# Patient Record
Sex: Female | Born: 1940 | Race: White | Hispanic: No | Marital: Married | Smoking: Former smoker
Health system: Southern US, Community
[De-identification: ages and names within clinical notes are randomized; demographics above are authoritative.]

## PROBLEM LIST (undated history)

## (undated) DIAGNOSIS — Z950 Presence of cardiac pacemaker: Secondary | ICD-10-CM

## (undated) DIAGNOSIS — Z86010 Personal history of colonic polyps: Secondary | ICD-10-CM

## (undated) DIAGNOSIS — Z87440 Personal history of urinary (tract) infections: Secondary | ICD-10-CM

## (undated) DIAGNOSIS — M2550 Pain in unspecified joint: Secondary | ICD-10-CM

## (undated) DIAGNOSIS — I499 Cardiac arrhythmia, unspecified: Secondary | ICD-10-CM

## (undated) DIAGNOSIS — I639 Cerebral infarction, unspecified: Secondary | ICD-10-CM

## (undated) DIAGNOSIS — G629 Polyneuropathy, unspecified: Secondary | ICD-10-CM

## (undated) DIAGNOSIS — I10 Essential (primary) hypertension: Secondary | ICD-10-CM

## (undated) DIAGNOSIS — K5900 Constipation, unspecified: Secondary | ICD-10-CM

## (undated) DIAGNOSIS — I509 Heart failure, unspecified: Secondary | ICD-10-CM

## (undated) DIAGNOSIS — R32 Unspecified urinary incontinence: Secondary | ICD-10-CM

## (undated) DIAGNOSIS — R609 Edema, unspecified: Secondary | ICD-10-CM

## (undated) DIAGNOSIS — M1990 Unspecified osteoarthritis, unspecified site: Secondary | ICD-10-CM

## (undated) DIAGNOSIS — R0602 Shortness of breath: Secondary | ICD-10-CM

## (undated) DIAGNOSIS — Q211 Atrial septal defect: Secondary | ICD-10-CM

## (undated) DIAGNOSIS — Z9289 Personal history of other medical treatment: Secondary | ICD-10-CM

## (undated) DIAGNOSIS — M2540 Effusion, unspecified joint: Secondary | ICD-10-CM

## (undated) DIAGNOSIS — E611 Iron deficiency: Secondary | ICD-10-CM

## (undated) DIAGNOSIS — Z95810 Presence of automatic (implantable) cardiac defibrillator: Secondary | ICD-10-CM

## (undated) DIAGNOSIS — S065X9A Traumatic subdural hemorrhage with loss of consciousness of unspecified duration, initial encounter: Secondary | ICD-10-CM

## (undated) HISTORY — PX: CARDIOVERSION: SHX1299

## (undated) HISTORY — PX: SALPINGOOPHORECTOMY: SHX82

## (undated) HISTORY — PX: TONSILLECTOMY: SUR1361

## (undated) HISTORY — PX: WRIST FRACTURE SURGERY: SHX121

## (undated) HISTORY — PX: APPENDECTOMY: SHX54

## (undated) HISTORY — PX: JOINT REPLACEMENT: SHX530

---

## 1999-06-03 HISTORY — PX: TOTAL HIP ARTHROPLASTY: SHX124

## 2003-06-03 HISTORY — PX: COLONOSCOPY: SHX174

## 2005-06-25 ENCOUNTER — Encounter: Admission: RE | Admit: 2005-06-25 | Discharge: 2005-06-25 | Payer: Self-pay | Admitting: Unknown Physician Specialty

## 2007-06-03 HISTORY — PX: CARDIAC CATHETERIZATION: SHX172

## 2007-06-03 HISTORY — PX: OTHER SURGICAL HISTORY: SHX169

## 2007-07-11 IMAGING — US US EXTREM LOW VENOUS*L*
1 series · 14 of 20 positions shown · non-contrast
Comparison: none

CLINICAL DATA: LEFT LOWER EXTREMITY VENOUS ULTRASOUND:
TECHNIQUE: Gray-scale sonography with compression, as well as color and duplex Doppler ultrasound, were performed to evaluate the deep venous system from the level of the common femoral vein through the popliteal and proximal calf veins.

[Series 1: unknown · 14 of 20 slices shown]
[im 1/20]
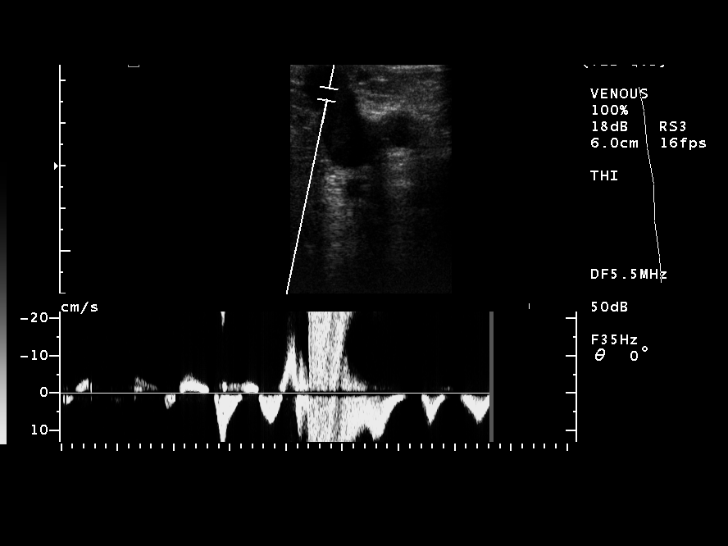
[im 3/20]
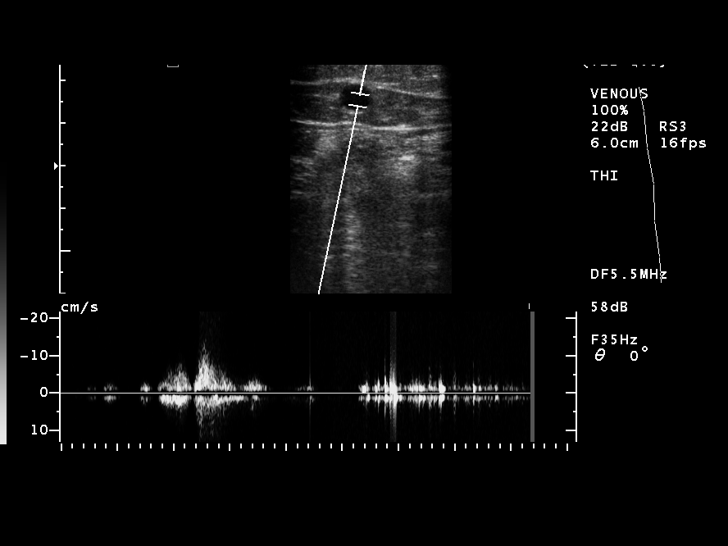
[im 4/20]
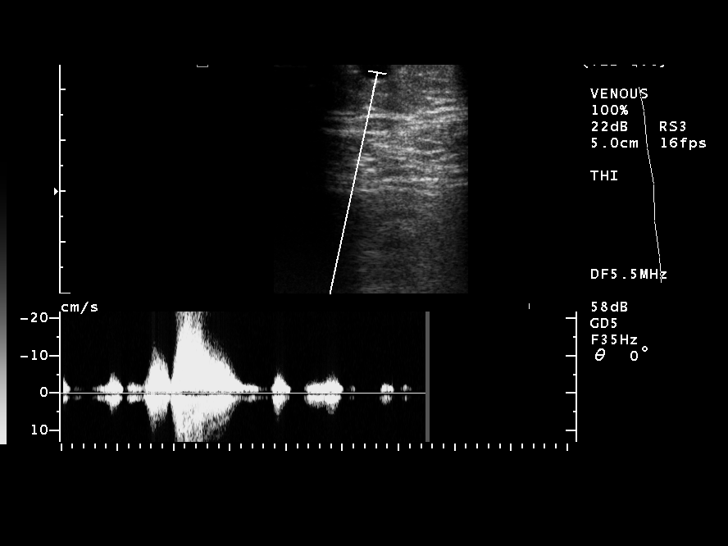
[im 6/20]
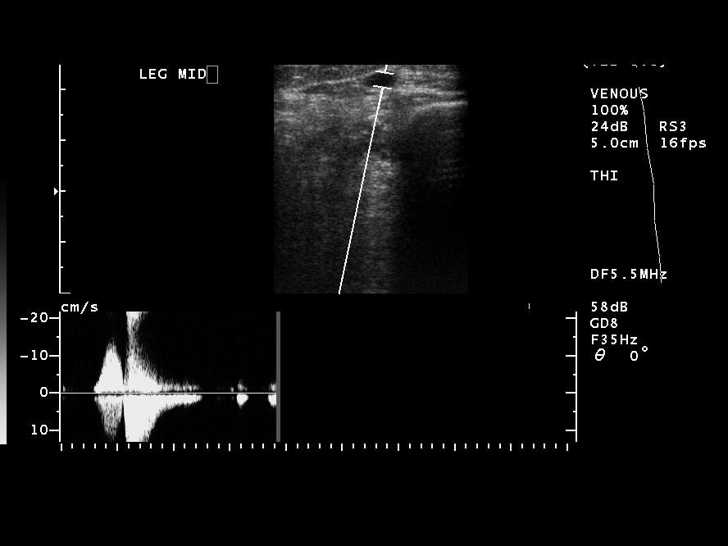
[im 7/20]
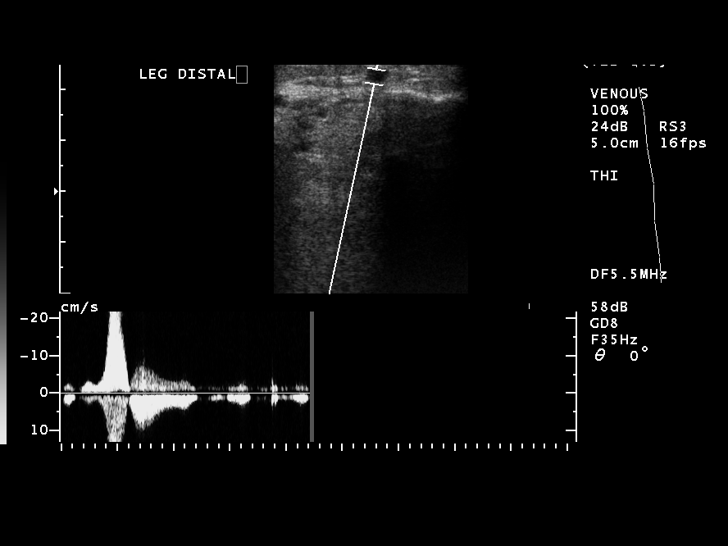
[im 8/20]
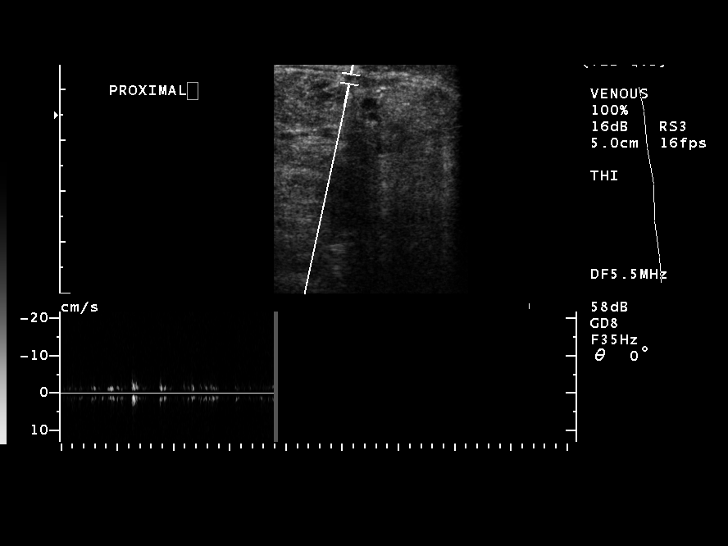
[im 10/20]
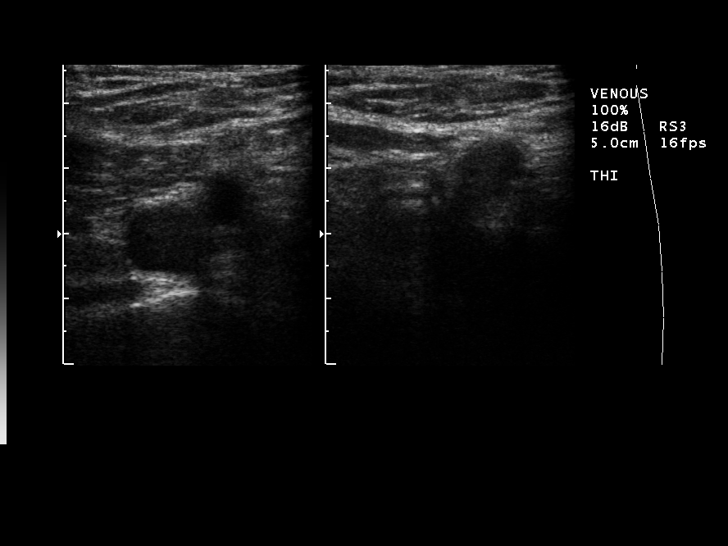
[im 11/20]
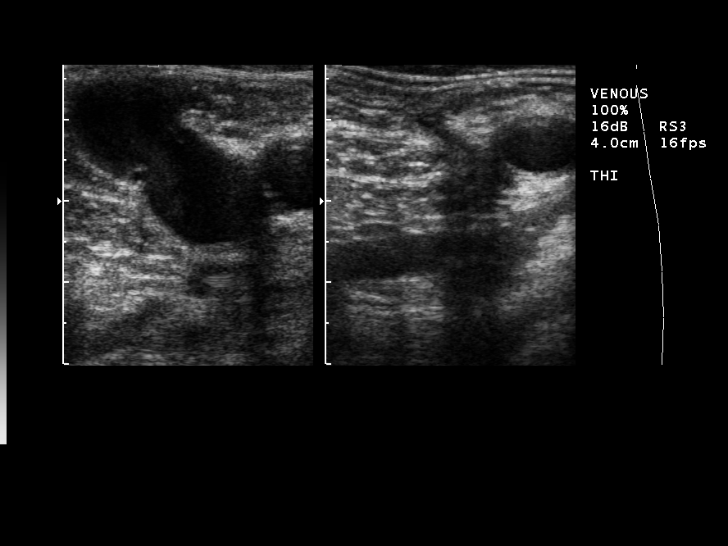
[im 13/20]
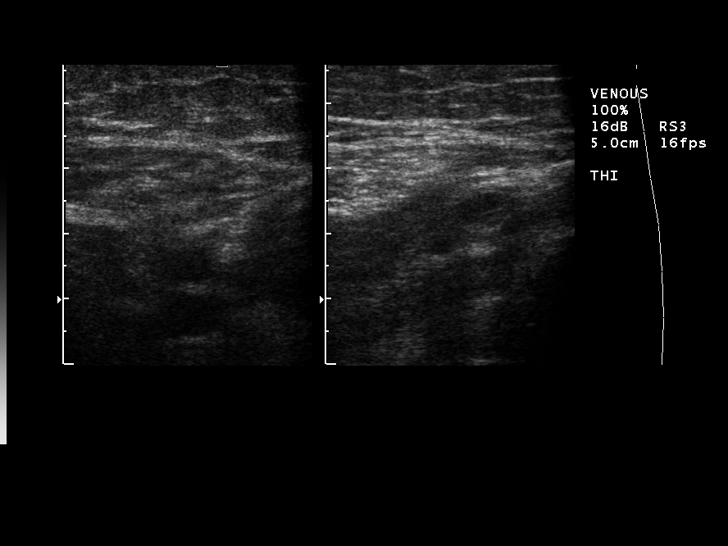
[im 14/20]
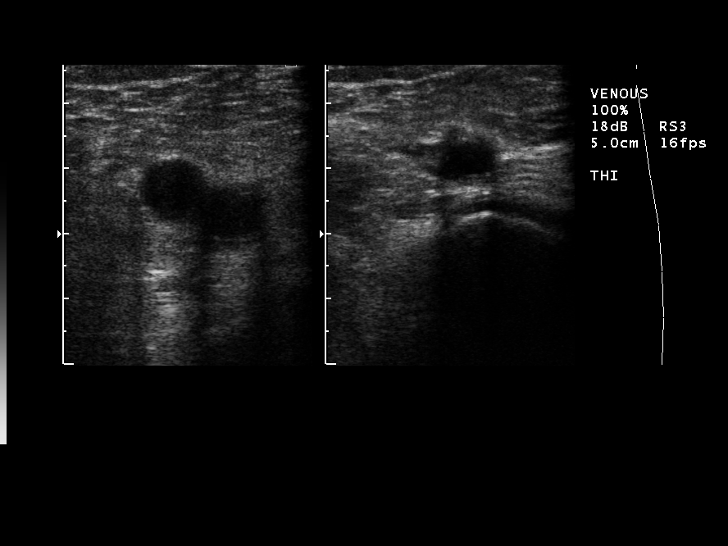
[im 16/20]
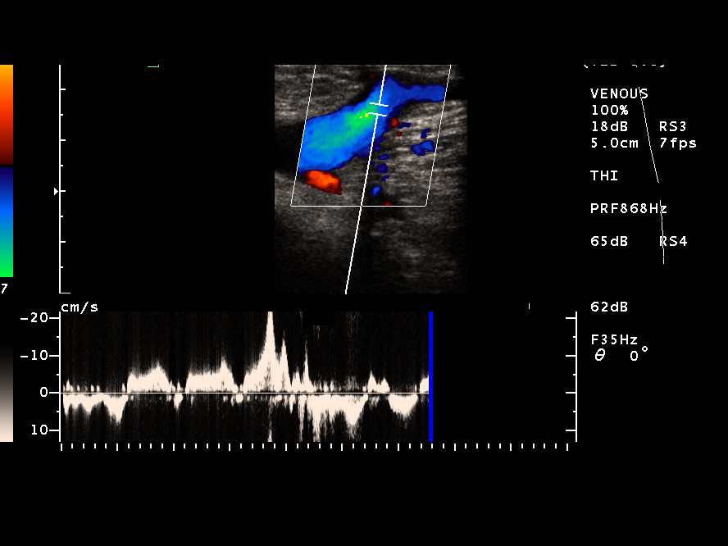
[im 17/20]
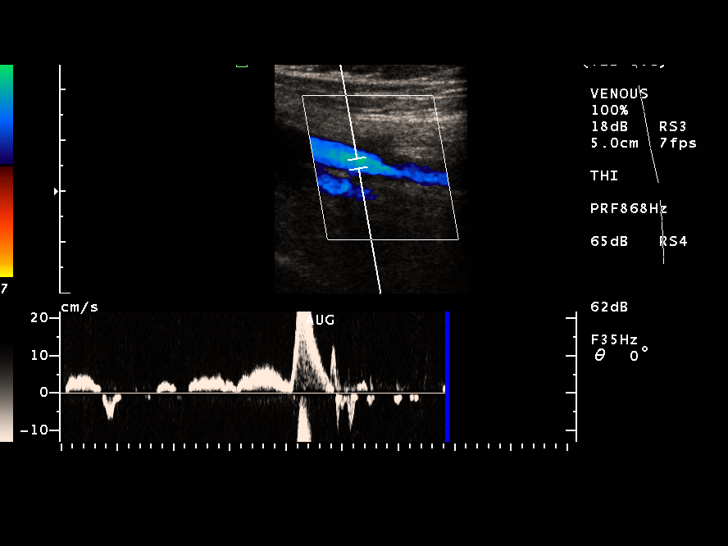
[im 18/20]
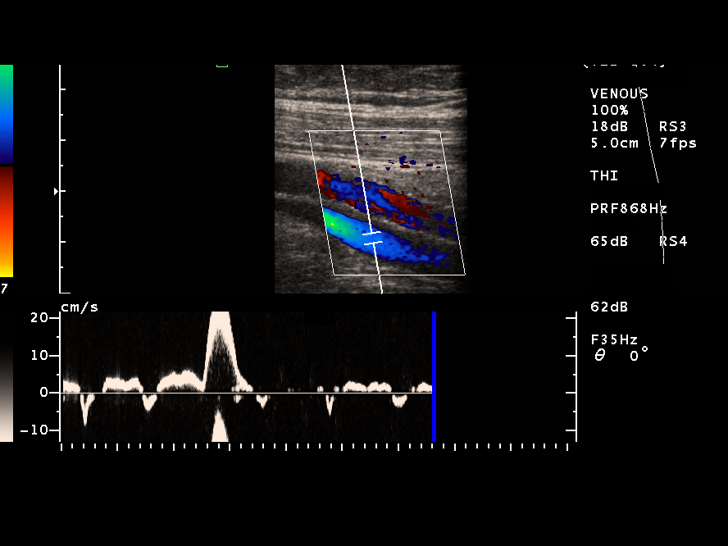
[im 20/20]
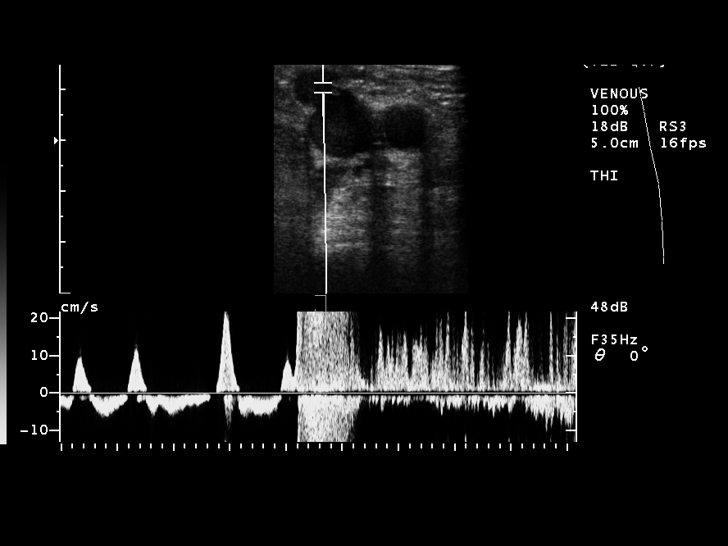

[14 of 20 positions shown; findings below may reference images not displayed]

FINDINGS: The left great saphenous vein refluxes significantly from the saphenofemoral junction all the way to the ankle.  Multiple large varicosities Perlman from the great saphenous vein in the region of the distal thigh and proximal calf.  In the distal thigh, the vessel does leave the sheath superficially, then re-enters below the knee.  The left short saphenous vein is very small and competent without evidence of reflux.  Deep venous system is widely patent with normal compressibility and augmentation.
IMPRESSION: 1.  Significant reflux within the left great saphenous vein from the saphenofemoral junction to the ankle, giving rise to the large varicosities in the medial thigh and calf.  
2.  No DVT.

## 2010-06-23 ENCOUNTER — Encounter: Payer: Self-pay | Admitting: Unknown Physician Specialty

## 2013-03-29 ENCOUNTER — Other Ambulatory Visit: Payer: Self-pay | Admitting: Orthopedic Surgery

## 2013-03-31 ENCOUNTER — Encounter (HOSPITAL_COMMUNITY): Payer: Self-pay | Admitting: Pharmacy Technician

## 2013-04-04 ENCOUNTER — Encounter (HOSPITAL_COMMUNITY): Payer: Self-pay

## 2013-04-04 ENCOUNTER — Encounter (HOSPITAL_COMMUNITY)
Admission: RE | Admit: 2013-04-04 | Discharge: 2013-04-04 | Disposition: A | Discharge status: Home / Self Care | Payer: Medicare Other | Source: Ambulatory Visit | Attending: Orthopedic Surgery | Admitting: Orthopedic Surgery

## 2013-04-04 DIAGNOSIS — Z01818 Encounter for other preprocedural examination: Secondary | ICD-10-CM | POA: Insufficient documentation

## 2013-04-04 DIAGNOSIS — Z01812 Encounter for preprocedural laboratory examination: Secondary | ICD-10-CM | POA: Insufficient documentation

## 2013-04-04 HISTORY — DX: Presence of cardiac pacemaker: Z95.0

## 2013-04-04 HISTORY — DX: Personal history of colonic polyps: Z86.010

## 2013-04-04 HISTORY — DX: Essential (primary) hypertension: I10

## 2013-04-04 HISTORY — DX: Edema, unspecified: R60.9

## 2013-04-04 HISTORY — DX: Unspecified urinary incontinence: R32

## 2013-04-04 HISTORY — DX: Constipation, unspecified: K59.00

## 2013-04-04 HISTORY — DX: Personal history of other medical treatment: Z92.89

## 2013-04-04 HISTORY — DX: Polyneuropathy, unspecified: G62.9

## 2013-04-04 HISTORY — DX: Pain in unspecified joint: M25.50

## 2013-04-04 HISTORY — DX: Cardiac arrhythmia, unspecified: I49.9

## 2013-04-04 HISTORY — DX: Iron deficiency: E61.1

## 2013-04-04 HISTORY — DX: Personal history of urinary (tract) infections: Z87.440

## 2013-04-04 HISTORY — DX: Heart failure, unspecified: I50.9

## 2013-04-04 HISTORY — DX: Effusion, unspecified joint: M25.40

## 2013-04-04 LAB — APTT: aPTT: 35 seconds (ref 24–37)

## 2013-04-04 LAB — SURGICAL PCR SCREEN
MRSA, PCR: NEGATIVE
Staphylococcus aureus: NEGATIVE

## 2013-04-04 LAB — CBC WITH DIFFERENTIAL/PLATELET
Basophils Absolute: 0.1 10*3/uL (ref 0.0–0.1)
Basophils Relative: 1 % (ref 0–1)
Eosinophils Absolute: 0.2 10*3/uL (ref 0.0–0.7)
Eosinophils Relative: 2 % (ref 0–5)
HCT: 36.8 % (ref 36.0–46.0)
Hemoglobin: 12.5 g/dL (ref 12.0–15.0)
Lymphocytes Relative: 22 % (ref 12–46)
MCH: 30.9 pg (ref 26.0–34.0)
MCHC: 34 g/dL (ref 30.0–36.0)
Monocytes Absolute: 0.7 10*3/uL (ref 0.1–1.0)
Monocytes Relative: 9 % (ref 3–12)
Platelets: 246 10*3/uL (ref 150–400)
RDW: 14.4 % (ref 11.5–15.5)
WBC: 7.7 10*3/uL (ref 4.0–10.5)

## 2013-04-04 LAB — COMPREHENSIVE METABOLIC PANEL
Albumin: 3.9 g/dL (ref 3.5–5.2)
BUN: 14 mg/dL (ref 6–23)
Calcium: 9.9 mg/dL (ref 8.4–10.5)
Chloride: 105 mEq/L (ref 96–112)
Creatinine, Ser: 0.49 mg/dL — ABNORMAL LOW (ref 0.50–1.10)
Potassium: 4 mEq/L (ref 3.5–5.1)
Total Bilirubin: 0.4 mg/dL (ref 0.3–1.2)
Total Protein: 7.7 g/dL (ref 6.0–8.3)

## 2013-04-04 LAB — PROTIME-INR
INR: 1.85 — ABNORMAL HIGH (ref 0.00–1.49)
Prothrombin Time: 20.8 seconds — ABNORMAL HIGH (ref 11.6–15.2)

## 2013-04-04 LAB — URINALYSIS, ROUTINE W REFLEX MICROSCOPIC
Bilirubin Urine: NEGATIVE
Nitrite: NEGATIVE
Protein, ur: NEGATIVE mg/dL
Specific Gravity, Urine: 1.013 (ref 1.005–1.030)
Urobilinogen, UA: 0.2 mg/dL (ref 0.0–1.0)

## 2013-04-04 LAB — ABO/RH: ABO/RH(D): O POS

## 2013-04-04 MED ORDER — CHLORHEXIDINE GLUCONATE 4 % EX LIQD: 60.0000 mL | Freq: Once | CUTANEOUS | Status: DC

## 2013-04-04 NOTE — Pre-Procedure Instructions (Signed)
Sara Underwood  04/04/2013   Your procedure is scheduled on:  Mon, Nov 10 @ 7:30 AM  Report to [facility] Short Stay Entrance A at 5:30 AM.  Call this number if you have problems the morning of surgery: [phone]   Remember:   Do not eat food or drink liquids after midnight.   Take these medicines the morning of surgery with A SIP OF WATER: Carvedilol(Coreg) and Diltiazem(Cardizem)              Stop taking your Aspirin.No Goody's,BC's,Aleve,Ibuprofen,Fish Oil,or any Herbal Medications   Do not wear jewelry, make-up or nail polish.  Do not wear lotions, powders, or perfumes. You may wear deodorant.  Do not shave 48 hours prior to surgery.   Do not bring valuables to the hospital.  [facility] is not responsible                  for any belongings or valuables.               Contacts, dentures or bridgework may not be worn into surgery.  Leave suitcase in the car. After surgery it may be brought to your room.  For patients admitted to the hospital, discharge time is determined by your                treatment team.                 Special Instructions: Shower using CHG 2 nights before surgery and the night before surgery.  If you shower the day of surgery use CHG.  Use special wash - you have one bottle of CHG for all showers.  You should use approximately 1/3 of the bottle for each shower.   Please read over the following fact sheets that you were given: Pain Booklet, Coughing and Deep Breathing, Blood Transfusion Information, MRSA Information and Surgical Site Infection Prevention

## 2013-04-04 NOTE — Progress Notes (Signed)
Cardiologist with [facility] is Dr.Ricky Dorris Fetch [phone]  Denies ever having a stress test  Echo to be requested from Dr.Hendrickson-done   Heart cath only x 1 in April 2009  Medical Md is DR.Lajoyce Lauber  EKG to be requested from Dr.Hendrickson  Denies CXR in past yr

## 2013-04-05 LAB — URINE CULTURE: Colony Count: NO GROWTH

## 2013-04-05 LAB — TYPE AND SCREEN

## 2013-04-10 MED ORDER — SODIUM CHLORIDE 0.9 % IV SOLN
1000.0000 mg | INTRAVENOUS | Status: AC
  Administered 2013-04-11: 1000 mg via INTRAVENOUS
  Filled 2013-04-10: qty 10

## 2013-04-10 MED ORDER — CEFAZOLIN SODIUM-DEXTROSE 2-3 GM-% IV SOLR
2.0000 g | INTRAVENOUS | Status: AC
  Administered 2013-04-11: 2 g via INTRAVENOUS
  Filled 2013-04-10: qty 50

## 2013-04-11 ENCOUNTER — Encounter (HOSPITAL_COMMUNITY)
Admission: RE | Disposition: A | Discharge status: Home Health | Payer: Self-pay | Source: Ambulatory Visit | Attending: Orthopedic Surgery

## 2013-04-11 ENCOUNTER — Encounter (HOSPITAL_COMMUNITY): Payer: Self-pay | Admitting: *Deleted

## 2013-04-11 ENCOUNTER — Inpatient Hospital Stay (HOSPITAL_COMMUNITY)
Admission: RE | Admit: 2013-04-11 | Discharge: 2013-04-13 | DRG: 470 | Disposition: A | Discharge status: Home Health | Payer: Medicare Other | Source: Ambulatory Visit | Attending: Orthopedic Surgery | Admitting: Orthopedic Surgery

## 2013-04-11 ENCOUNTER — Inpatient Hospital Stay (HOSPITAL_COMMUNITY): Payer: Medicare Other | Admitting: Certified Registered Nurse Anesthetist

## 2013-04-11 ENCOUNTER — Encounter (HOSPITAL_COMMUNITY): Payer: Medicare Other | Admitting: Certified Registered Nurse Anesthetist

## 2013-04-11 DIAGNOSIS — Z96652 Presence of left artificial knee joint: Secondary | ICD-10-CM

## 2013-04-11 DIAGNOSIS — Z860100 Personal history of colon polyps, unspecified: Secondary | ICD-10-CM

## 2013-04-11 DIAGNOSIS — Z87891 Personal history of nicotine dependence: Secondary | ICD-10-CM

## 2013-04-11 DIAGNOSIS — Z87440 Personal history of urinary (tract) infections: Secondary | ICD-10-CM

## 2013-04-11 DIAGNOSIS — I509 Heart failure, unspecified: Secondary | ICD-10-CM | POA: Diagnosis present

## 2013-04-11 DIAGNOSIS — M1710 Unilateral primary osteoarthritis, unspecified knee: Principal | ICD-10-CM | POA: Diagnosis present

## 2013-04-11 DIAGNOSIS — I4891 Unspecified atrial fibrillation: Secondary | ICD-10-CM | POA: Diagnosis present

## 2013-04-11 DIAGNOSIS — Z95810 Presence of automatic (implantable) cardiac defibrillator: Secondary | ICD-10-CM

## 2013-04-11 DIAGNOSIS — I10 Essential (primary) hypertension: Secondary | ICD-10-CM | POA: Diagnosis present

## 2013-04-11 DIAGNOSIS — D62 Acute posthemorrhagic anemia: Secondary | ICD-10-CM | POA: Diagnosis not present

## 2013-04-11 HISTORY — DX: Unspecified osteoarthritis, unspecified site: M19.90

## 2013-04-11 HISTORY — DX: Presence of automatic (implantable) cardiac defibrillator: Z95.810

## 2013-04-11 HISTORY — PX: TOTAL KNEE ARTHROPLASTY: SHX125

## 2013-04-11 HISTORY — DX: Shortness of breath: R06.02

## 2013-04-11 LAB — CBC
HCT: 31.7 % — ABNORMAL LOW (ref 36.0–46.0)
Hemoglobin: 10.7 g/dL — ABNORMAL LOW (ref 12.0–15.0)
MCH: 30.9 pg (ref 26.0–34.0)
MCV: 91.6 fL (ref 78.0–100.0)
Platelets: 219 10*3/uL (ref 150–400)
RBC: 3.46 MIL/uL — ABNORMAL LOW (ref 3.87–5.11)
RDW: 14 % (ref 11.5–15.5)
WBC: 11.5 10*3/uL — ABNORMAL HIGH (ref 4.0–10.5)

## 2013-04-11 LAB — CREATININE, SERUM
GFR calc Af Amer: >90 mL/min (ref >90)
GFR calc non Af Amer: 89 mL/min — ABNORMAL LOW (ref >90)

## 2013-04-11 SURGERY — ARTHROPLASTY, KNEE, TOTAL
Anesthesia: Regional | Site: Knee | Laterality: Left | Wound class: Clean

## 2013-04-11 MED ORDER — CELECOXIB 200 MG PO CAPS
200.0000 mg | ORAL_CAPSULE | Freq: Two times a day (BID) | ORAL | Status: DC
  Administered 2013-04-11 – 2013-04-13 (×5): 200 mg via ORAL
  Filled 2013-04-11 (×6): qty 1

## 2013-04-11 MED ORDER — METOCLOPRAMIDE HCL 5 MG/ML IJ SOLN: 5.0000 mg | Freq: Three times a day (TID) | INTRAMUSCULAR | Status: DC | PRN

## 2013-04-11 MED ORDER — ACETAMINOPHEN 325 MG PO TABS: 650.0000 mg | ORAL_TABLET | Freq: Four times a day (QID) | ORAL | Status: DC | PRN

## 2013-04-11 MED ORDER — SODIUM CHLORIDE 0.9 % IJ SOLN: INTRAMUSCULAR | Status: DC | PRN

## 2013-04-11 MED ORDER — PROMETHAZINE HCL 25 MG/ML IJ SOLN: 6.2500 mg | INTRAMUSCULAR | Status: DC | PRN

## 2013-04-11 MED ORDER — FENTANYL CITRATE 0.05 MG/ML IJ SOLN
INTRAMUSCULAR | Status: DC | PRN
  Administered 2013-04-11 (×2): 25 ug via INTRAVENOUS
  Administered 2013-04-11: 50 ug via INTRAVENOUS
  Administered 2013-04-11: 25 ug via INTRAVENOUS

## 2013-04-11 MED ORDER — PROPOFOL 10 MG/ML IV BOLUS
INTRAVENOUS | Status: DC | PRN
  Administered 2013-04-11: 160 mg via INTRAVENOUS

## 2013-04-11 MED ORDER — HYDROMORPHONE HCL PF 1 MG/ML IJ SOLN
0.2500 mg | INTRAMUSCULAR | Status: DC | PRN
  Administered 2013-04-11 (×2): 0.5 mg via INTRAVENOUS

## 2013-04-11 MED ORDER — CARVEDILOL 12.5 MG PO TABS
12.5000 mg | ORAL_TABLET | Freq: Two times a day (BID) | ORAL | Status: DC
  Administered 2013-04-11 – 2013-04-13 (×4): 12.5 mg via ORAL
  Filled 2013-04-11 (×6): qty 1

## 2013-04-11 MED ORDER — CEFAZOLIN SODIUM 1-5 GM-% IV SOLN
1.0000 g | Freq: Four times a day (QID) | INTRAVENOUS | Status: AC
  Administered 2013-04-11 (×2): 1 g via INTRAVENOUS
  Filled 2013-04-11 (×2): qty 50

## 2013-04-11 MED ORDER — OXYCODONE HCL 5 MG PO TABS
5.0000 mg | ORAL_TABLET | ORAL | Status: DC | PRN
  Administered 2013-04-11 – 2013-04-12 (×6): 10 mg via ORAL
  Administered 2013-04-13: 5 mg via ORAL
  Filled 2013-04-11 (×5): qty 2
  Filled 2013-04-11: qty 1
  Filled 2013-04-11 (×2): qty 2

## 2013-04-11 MED ORDER — SODIUM CHLORIDE 0.9 % IV SOLN
INTRAVENOUS | Status: DC
  Administered 2013-04-11 – 2013-04-12 (×2): via INTRAVENOUS

## 2013-04-11 MED ORDER — FLEET ENEMA 7-19 GM/118ML RE ENEM: 1.0000 | ENEMA | Freq: Once | RECTAL | Status: AC | PRN

## 2013-04-11 MED ORDER — ACETAMINOPHEN 650 MG RE SUPP: 650.0000 mg | Freq: Four times a day (QID) | RECTAL | Status: DC | PRN

## 2013-04-11 MED ORDER — OXYCODONE HCL ER 10 MG PO T12A
10.0000 mg | EXTENDED_RELEASE_TABLET | Freq: Two times a day (BID) | ORAL | Status: DC
  Administered 2013-04-11 – 2013-04-13 (×4): 10 mg via ORAL
  Filled 2013-04-11 (×4): qty 1

## 2013-04-11 MED ORDER — SENNOSIDES-DOCUSATE SODIUM 8.6-50 MG PO TABS: 1.0000 | ORAL_TABLET | Freq: Every evening | ORAL | Status: DC | PRN

## 2013-04-11 MED ORDER — BUPIVACAINE-EPINEPHRINE PF 0.5-1:200000 % IJ SOLN
INTRAMUSCULAR | Status: DC | PRN
  Administered 2013-04-11: 30 mL

## 2013-04-11 MED ORDER — MEPERIDINE HCL 25 MG/ML IJ SOLN: 6.2500 mg | INTRAMUSCULAR | Status: DC | PRN

## 2013-04-11 MED ORDER — ONDANSETRON HCL 4 MG/2ML IJ SOLN
INTRAMUSCULAR | Status: DC | PRN
  Administered 2013-04-11: 4 mg via INTRAVENOUS

## 2013-04-11 MED ORDER — WARFARIN SODIUM 6 MG PO TABS
6.0000 mg | ORAL_TABLET | Freq: Once | ORAL | Status: AC
  Administered 2013-04-11: 6 mg via ORAL
  Filled 2013-04-11: qty 1

## 2013-04-11 MED ORDER — BUPIVACAINE-EPINEPHRINE 0.25% -1:200000 IJ SOLN
INTRAMUSCULAR | Status: DC | PRN
  Administered 2013-04-11: 30 mL

## 2013-04-11 MED ORDER — WARFARIN - PHARMACIST DOSING INPATIENT
Freq: Every day | Status: DC
  Administered 2013-04-12: 1

## 2013-04-11 MED ORDER — DIPHENHYDRAMINE HCL 12.5 MG/5ML PO ELIX
12.5000 mg | ORAL_SOLUTION | ORAL | Status: DC | PRN
  Administered 2013-04-12 (×2): 25 mg via ORAL
  Administered 2013-04-13: 12.5 mg via ORAL
  Filled 2013-04-11 (×3): qty 10

## 2013-04-11 MED ORDER — BUPIVACAINE HCL (PF) 0.5 % IJ SOLN: INTRAMUSCULAR | Status: DC | PRN

## 2013-04-11 MED ORDER — BUPIVACAINE LIPOSOME 1.3 % IJ SUSP
INTRAMUSCULAR | Status: DC | PRN
  Administered 2013-04-11: 20 mL

## 2013-04-11 MED ORDER — DILTIAZEM HCL ER COATED BEADS 180 MG PO CP24
180.0000 mg | ORAL_CAPSULE | Freq: Every day | ORAL | Status: DC
  Administered 2013-04-12 – 2013-04-13 (×2): 180 mg via ORAL
  Filled 2013-04-11 (×2): qty 1

## 2013-04-11 MED ORDER — METOCLOPRAMIDE HCL 10 MG PO TABS: 5.0000 mg | ORAL_TABLET | Freq: Three times a day (TID) | ORAL | Status: DC | PRN

## 2013-04-11 MED ORDER — ZOLPIDEM TARTRATE 5 MG PO TABS: 5.0000 mg | ORAL_TABLET | Freq: Every evening | ORAL | Status: DC | PRN

## 2013-04-11 MED ORDER — BUPIVACAINE-EPINEPHRINE PF 0.25-1:200000 % IJ SOLN
INTRAMUSCULAR | Status: AC
  Filled 2013-04-11: qty 30

## 2013-04-11 MED ORDER — LIDOCAINE HCL (CARDIAC) 20 MG/ML IV SOLN
INTRAVENOUS | Status: DC | PRN
  Administered 2013-04-11: 70 mg via INTRAVENOUS

## 2013-04-11 MED ORDER — LISINOPRIL 10 MG PO TABS
10.0000 mg | ORAL_TABLET | Freq: Two times a day (BID) | ORAL | Status: DC
  Administered 2013-04-11 – 2013-04-13 (×4): 10 mg via ORAL
  Filled 2013-04-11 (×5): qty 1

## 2013-04-11 MED ORDER — MENTHOL 3 MG MT LOZG: 1.0000 | LOZENGE | OROMUCOSAL | Status: DC | PRN

## 2013-04-11 MED ORDER — OXYCODONE HCL 5 MG/5ML PO SOLN: 5.0000 mg | Freq: Once | ORAL | Status: DC | PRN

## 2013-04-11 MED ORDER — METHOCARBAMOL 100 MG/ML IJ SOLN
500.0000 mg | Freq: Four times a day (QID) | INTRAVENOUS | Status: DC | PRN
  Filled 2013-04-11: qty 5

## 2013-04-11 MED ORDER — HYDROMORPHONE HCL PF 1 MG/ML IJ SOLN
INTRAMUSCULAR | Status: AC
  Filled 2013-04-11: qty 1

## 2013-04-11 MED ORDER — LACTATED RINGERS IV SOLN
INTRAVENOUS | Status: DC | PRN
  Administered 2013-04-11 (×2): via INTRAVENOUS

## 2013-04-11 MED ORDER — ONDANSETRON HCL 4 MG PO TABS: 4.0000 mg | ORAL_TABLET | Freq: Four times a day (QID) | ORAL | Status: DC | PRN

## 2013-04-11 MED ORDER — MIDAZOLAM HCL 5 MG/5ML IJ SOLN
INTRAMUSCULAR | Status: DC | PRN
  Administered 2013-04-11 (×2): 1 mg via INTRAVENOUS

## 2013-04-11 MED ORDER — BUPIVACAINE LIPOSOME 1.3 % IJ SUSP
20.0000 mL | Freq: Once | INTRAMUSCULAR | Status: DC
  Filled 2013-04-11: qty 20

## 2013-04-11 MED ORDER — DOCUSATE SODIUM 100 MG PO CAPS
100.0000 mg | ORAL_CAPSULE | Freq: Two times a day (BID) | ORAL | Status: DC
  Administered 2013-04-11 – 2013-04-13 (×5): 100 mg via ORAL
  Filled 2013-04-11 (×6): qty 1

## 2013-04-11 MED ORDER — HYDROMORPHONE HCL PF 1 MG/ML IJ SOLN
1.0000 mg | INTRAMUSCULAR | Status: DC | PRN
  Administered 2013-04-12: 1 mg via INTRAVENOUS
  Filled 2013-04-11: qty 1

## 2013-04-11 MED ORDER — ENOXAPARIN SODIUM 30 MG/0.3ML ~~LOC~~ SOLN
30.0000 mg | Freq: Two times a day (BID) | SUBCUTANEOUS | Status: DC
  Administered 2013-04-12 – 2013-04-13 (×3): 30 mg via SUBCUTANEOUS
  Filled 2013-04-11 (×5): qty 0.3

## 2013-04-11 MED ORDER — OXYCODONE HCL 5 MG PO TABS: 5.0000 mg | ORAL_TABLET | Freq: Once | ORAL | Status: DC | PRN

## 2013-04-11 MED ORDER — ALUM & MAG HYDROXIDE-SIMETH 200-200-20 MG/5ML PO SUSP: 30.0000 mL | ORAL | Status: DC | PRN

## 2013-04-11 MED ORDER — ONDANSETRON HCL 4 MG/2ML IJ SOLN: 4.0000 mg | Freq: Four times a day (QID) | INTRAMUSCULAR | Status: DC | PRN

## 2013-04-11 MED ORDER — BISACODYL 5 MG PO TBEC: 5.0000 mg | DELAYED_RELEASE_TABLET | Freq: Every day | ORAL | Status: DC | PRN

## 2013-04-11 MED ORDER — PHENOL 1.4 % MT LIQD: 1.0000 | OROMUCOSAL | Status: DC | PRN

## 2013-04-11 MED ORDER — SODIUM CHLORIDE 0.9 % IV SOLN: INTRAVENOUS | Status: DC

## 2013-04-11 MED ORDER — METHOCARBAMOL 500 MG PO TABS
500.0000 mg | ORAL_TABLET | Freq: Four times a day (QID) | ORAL | Status: DC | PRN
  Administered 2013-04-12: 500 mg via ORAL
  Filled 2013-04-11 (×3): qty 1

## 2013-04-11 MED ORDER — SODIUM CHLORIDE 0.9 % IR SOLN
Status: DC | PRN
  Administered 2013-04-11: 3000 mL

## 2013-04-11 SURGICAL SUPPLY — 56 items
BANDAGE ESMARK 6X9 LF (GAUZE/BANDAGES/DRESSINGS) ×1 IMPLANT
BLADE SAGITTAL 13X1.27X60 (BLADE) ×2 IMPLANT
BLADE SAW SGTL 83.5X18.5 (BLADE) ×2 IMPLANT
BNDG CMPR 9X6 STRL LF SNTH (GAUZE/BANDAGES/DRESSINGS) ×1
BNDG ESMARK 6X9 LF (GAUZE/BANDAGES/DRESSINGS) ×2
BOWL SMART MIX CTS (DISPOSABLE) ×2 IMPLANT
CAP POR TM CP VIT E LN CER HD ×1 IMPLANT
CEMENT BONE SIMPLEX SPEEDSET (Cement) ×4 IMPLANT
CLOTH BEACON ORANGE TIMEOUT ST (SAFETY) ×2 IMPLANT
COVER SURGICAL LIGHT HANDLE (MISCELLANEOUS) ×2 IMPLANT
CUFF TOURNIQUET SINGLE 34IN LL (TOURNIQUET CUFF) ×2 IMPLANT
DRAPE EXTREMITY T 121X128X90 (DRAPE) ×2 IMPLANT
DRAPE INCISE IOBAN 66X45 STRL (DRAPES) ×4 IMPLANT
DRAPE PROXIMA HALF (DRAPES) ×2 IMPLANT
DRAPE U-SHAPE 47X51 STRL (DRAPES) ×2 IMPLANT
DRSG ADAPTIC 3X8 NADH LF (GAUZE/BANDAGES/DRESSINGS) ×2 IMPLANT
DRSG PAD ABDOMINAL 8X10 ST (GAUZE/BANDAGES/DRESSINGS) ×2 IMPLANT
DURAPREP 26ML APPLICATOR (WOUND CARE) ×4 IMPLANT
ELECT REM PT RETURN 9FT ADLT (ELECTROSURGICAL) ×2
ELECTRODE REM PT RTRN 9FT ADLT (ELECTROSURGICAL) ×1 IMPLANT
EVACUATOR 1/8 PVC DRAIN (DRAIN) ×2 IMPLANT
GLOVE BIOGEL M 7.0 STRL (GLOVE) IMPLANT
GLOVE BIOGEL PI IND STRL 7.5 (GLOVE) IMPLANT
GLOVE BIOGEL PI IND STRL 8.5 (GLOVE) ×2 IMPLANT
GLOVE BIOGEL PI INDICATOR 7.5 (GLOVE)
GLOVE BIOGEL PI INDICATOR 8.5 (GLOVE) ×2
GLOVE SURG ORTHO 8.0 STRL STRW (GLOVE) ×4 IMPLANT
GOWN PREVENTION PLUS XLARGE (GOWN DISPOSABLE) ×4 IMPLANT
GOWN STRL NON-REIN LRG LVL3 (GOWN DISPOSABLE) ×4 IMPLANT
HANDPIECE INTERPULSE COAX TIP (DISPOSABLE) ×2
HOOD PEEL AWAY FACE SHEILD DIS (HOOD) ×8 IMPLANT
KIT BASIN OR (CUSTOM PROCEDURE TRAY) ×2 IMPLANT
KIT ROOM TURNOVER OR (KITS) ×2 IMPLANT
MANIFOLD NEPTUNE II (INSTRUMENTS) ×2 IMPLANT
NDL HYPO 21X1.5 SAFETY (NEEDLE) ×1 IMPLANT
NEEDLE 22X1 1/2 (OR ONLY) (NEEDLE) ×2 IMPLANT
NEEDLE HYPO 21X1.5 SAFETY (NEEDLE) ×2 IMPLANT
NS IRRIG 1000ML POUR BTL (IV SOLUTION) ×2 IMPLANT
PACK TOTAL JOINT (CUSTOM PROCEDURE TRAY) ×2 IMPLANT
PAD ARMBOARD 7.5X6 YLW CONV (MISCELLANEOUS) ×4 IMPLANT
PADDING CAST COTTON 6X4 STRL (CAST SUPPLIES) ×2 IMPLANT
SET HNDPC FAN SPRY TIP SCT (DISPOSABLE) ×1 IMPLANT
SPONGE GAUZE 4X4 12PLY (GAUZE/BANDAGES/DRESSINGS) ×2 IMPLANT
STAPLER VISISTAT 35W (STAPLE) ×2 IMPLANT
SUCTION FRAZIER TIP 10 FR DISP (SUCTIONS) ×2 IMPLANT
SUT BONE WAX W31G (SUTURE) ×2 IMPLANT
SUT VIC AB 0 CTB1 27 (SUTURE) ×4 IMPLANT
SUT VIC AB 1 CT1 27 (SUTURE) ×4
SUT VIC AB 1 CT1 27XBRD ANBCTR (SUTURE) ×2 IMPLANT
SUT VIC AB 2-0 CT1 27 (SUTURE) ×4
SUT VIC AB 2-0 CT1 TAPERPNT 27 (SUTURE) ×2 IMPLANT
SYR CONTROL 10ML LL (SYRINGE) ×2 IMPLANT
TOWEL OR 17X24 6PK STRL BLUE (TOWEL DISPOSABLE) ×2 IMPLANT
TOWEL OR 17X26 10 PK STRL BLUE (TOWEL DISPOSABLE) ×2 IMPLANT
TRAY FOLEY CATH 16FRSI W/METER (SET/KITS/TRAYS/PACK) ×2 IMPLANT
WATER STERILE IRR 1000ML POUR (IV SOLUTION) ×4 IMPLANT

## 2013-04-11 NOTE — Progress Notes (Signed)
ICD turned back on by Gardiner Fanti [facility]

## 2013-04-11 NOTE — Anesthesia Preprocedure Evaluation (Addendum)
Anesthesia Evaluation  Patient identified by MRN, date of birth, ID band Patient awake    Reviewed: Allergy & Precautions, H&P , NPO status , Patient's Chart, lab work & pertinent test results, reviewed documented beta blocker date and time   History of Anesthesia Complications Negative for: history of anesthetic complications  Airway Mallampati: I TM Distance: >3 FB Neck ROM: Full    Dental  (+) Dental Advisory Given and Teeth Intact   Pulmonary shortness of breath and with exertion, former smoker,    Pulmonary exam normal       Cardiovascular hypertension, Pt. on medications and Pt. on home beta blockers +CHF + dysrhythmias (INR 1.85 last week) Atrial Fibrillation + pacemaker + Cardiac Defibrillator Rhythm:Regular Rate:Normal  '09 cath: normal coronaries, EF 29% '12 ECHO: with biV pacing EF > 55%   Neuro/Psych    GI/Hepatic negative GI ROS, Neg liver ROS,   Endo/Other  negative endocrine ROS  Renal/GU negative Renal ROS     Musculoskeletal  (+) Arthritis -,   Abdominal   Peds  Hematology   Anesthesia Other Findings   Reproductive/Obstetrics                         Anesthesia Physical Anesthesia Plan  ASA: III  Anesthesia Plan: General and Regional   Post-op Pain Management: MAC Combined w/ Regional for Post-op pain   Induction: Intravenous  Airway Management Planned: Oral ETT and LMA  Additional Equipment:   Intra-op Plan:   Post-operative Plan:   Informed Consent: I have reviewed the patients History and Physical, chart, labs and discussed the procedure including the risks, benefits and alternatives for the proposed anesthesia with the patient or authorized representative who has indicated his/her understanding and acceptance.   Dental advisory given  Plan Discussed with: Anesthesiologist, Surgeon and CRNA  Anesthesia Plan Comments: (Plan routine monitors, GA- LMA OK,  femoral nerve block for post op analgesia)       Anesthesia Quick Evaluation

## 2013-04-11 NOTE — Addendum Note (Signed)
Addendum created 04/11/13 1235 by Elberta Leatherwood, CRNA   Modules edited: Anesthesia Flowsheet

## 2013-04-11 NOTE — Progress Notes (Signed)
UR COMPLETED  

## 2013-04-11 NOTE — Anesthesia Postprocedure Evaluation (Signed)
  Anesthesia Post-op Note  Patient: Sara Underwood  Procedure(s) Performed: Procedure(s): LEFT TOTAL KNEE ARTHROPLASTY (Left)  Patient Location: PACU  Anesthesia Type:GA combined with regional for post-op pain  Level of Consciousness: awake, alert , oriented and patient cooperative  Airway and Oxygen Therapy: Patient Spontanous Breathing and Patient connected to nasal cannula oxygen  Post-op Pain: mild  Post-op Assessment: Post-op Vital signs reviewed, Patient's Cardiovascular Status Stable, Respiratory Function Stable, Patent Airway, No signs of Nausea or vomiting and Pain level controlled  Post-op Vital Signs: Reviewed and stable  Complications: No apparent anesthesia complications

## 2013-04-11 NOTE — Transfer of Care (Signed)
Immediate Anesthesia Transfer of Care Note  Patient: Sara Underwood  Procedure(s) Performed: Procedure(s): LEFT TOTAL KNEE ARTHROPLASTY (Left)  Patient Location: PACU  Anesthesia Type:General and Regional  Level of Consciousness: awake, alert  and oriented  Airway & Oxygen Therapy: Patient Spontanous Breathing and Patient connected to nasal cannula oxygen  Post-op Assessment: Report given to PACU RN, Post -op Vital signs reviewed and stable and Patient moving all extremities X 4  Post vital signs: Reviewed and stable  Complications: No apparent anesthesia complications

## 2013-04-11 NOTE — H&P (Signed)
Sara Underwood MRN:  [id number] DOB/SEX:  04/03/1941/female  CHIEF COMPLAINT:  Painful left Knee  HISTORY: Patient is a 72 y.o. female presented with a history of pain in the left knee. Onset of symptoms was gradual starting several years ago with gradually worsening course since that time. Prior procedures on the knee include none. Patient has been treated conservatively with over-the-counter NSAIDs and activity modification. Patient currently rates pain in the knee at 9 out of 10 with activity. There is pain at night.  PAST MEDICAL HISTORY: There are no active problems to display for this patient.  Past Medical History  Diagnosis Date  . CHF (congestive heart failure)   . Dysrhythmia     takes Coreg bid  . Hypertension     takes Diltiazem daily and Lisinopril bid  . Pacemaker   . ICD (implantable cardioverter-defibrillator) in place   . Neuropathy     left  . Shortness of breath     with exertion  . Arthritis   . Joint pain   . Joint swelling   . Peripheral edema     left leg  . Constipation   . History of colon polyps   . Urinary incontinence   . History of bladder infections     2months ago  . History of blood transfusion   . Low iron    Past Surgical History  Procedure Laterality Date  . Tonsillectomy    . Right ovary and fallopian tube removed    . Joint replacement Right 2001  . Cardiac catheterization  2009  . Pacemaker/defib  2009  . Cardioversion    . Right wrist surgery  80's    manipulation under anesthesia  . Colonoscopy  2005     MEDICATIONS:   Prescriptions prior to admission  Medication Sig Dispense Refill  . aspirin EC 81 MG tablet Take 81 mg by mouth daily.      . Calcium Citrate-Vitamin D 1000-400 LIQD Take 1 tablet by mouth 2 (two) times daily.      . carvedilol (COREG) 12.5 MG tablet Take 12.5 mg by mouth 2 (two) times daily.      Marland Kitchen diltiazem (CARDIZEM CD) 180 MG 24 hr capsule Take 180 mg by mouth daily.      Marland Kitchen lisinopril (PRINIVIL,ZESTRIL)  10 MG tablet Take 10 mg by mouth 2 (two) times daily.      Marland Kitchen warfarin (COUMADIN) 6 MG tablet Take 3-6 mg by mouth daily. Takes  tablet on Mondays, Tuesdays, Thursdays, and Friday.  Takes 1 tablet on Saturdays, Sundays, and Wednesdays.        ALLERGIES:   Allergies  Allergen Reactions  . Povidone Iodine Hives    REVIEW OF SYSTEMS:  Pertinent items are noted in HPI.   FAMILY HISTORY:  History reviewed. No pertinent family history.  SOCIAL HISTORY:   History  Substance Use Topics  . Smoking status: Former Games developer  . Smokeless tobacco: Not on file     Comment: quit smoking 25 yrs ago;social smoker  . Alcohol Use: Yes     Comment: wine occasionally     EXAMINATION:  Vital signs in last 24 hours: Temp:  [97.5 F (36.4 C)] 97.5 F (36.4 C) (11/10 0556) Pulse Rate:  [70] 70 (11/10 0555) Resp:  [18] 18 (11/10 0555) BP: (149)/(87) 149/87 mmHg (11/10 0555) SpO2:  [97 %] 97 % (11/10 0555)  General appearance: alert, cooperative and no distress Lungs: clear to auscultation bilaterally Heart: regular rate and rhythm,  S1, S2 normal, no murmur, click, rub or gallop Abdomen: soft, non-tender; bowel sounds normal; no masses,  no organomegaly Extremities: extremities normal, atraumatic, no cyanosis or edema and Homans sign is negative, no sign of DVT Pulses: 2+ and symmetric Skin: Skin color, texture, turgor normal. No rashes or lesions Neurologic: Alert and oriented X 3, normal strength and tone. Normal symmetric reflexes. Normal coordination and gait  Musculoskeletal:  ROM 0-115, Ligaments intact,  Imaging Review Plain radiographs demonstrate severe degenerative joint disease of the left knee. The overall alignment is mild valgus. The bone quality appears to be good for age and reported activity level.  Assessment/Plan: End stage arthritis, left knee   The patient history, physical examination and imaging studies are consistent with advanced degenerative joint disease of the left  knee. The patient has failed conservative treatment.  The clearance notes were reviewed.  After discussion with the patient it was felt that Total Knee Replacement was indicated. The procedure,  risks, and benefits of total knee arthroplasty were presented and reviewed. The risks including but not limited to aseptic loosening, infection, blood clots, vascular injury, stiffness, patella tracking problems complications among others were discussed. The patient acknowledged the explanation, agreed to proceed with the plan.  Sara Underwood 04/11/2013, 6:41 AM

## 2013-04-11 NOTE — Progress Notes (Signed)
Orthopedic Tech Progress Note Patient Details:  Sara Underwood 11/18/1940 [id number]  CPM Left Knee CPM Left Knee: On Left Knee Flexion (Degrees): 90 Left Knee Extension (Degrees): 0 Additional Comments: Trapeze bar and foot roll   Shawnie Pons 04/11/2013, 10:47 AM

## 2013-04-11 NOTE — Anesthesia Procedure Notes (Addendum)
Procedure Name: LMA Insertion Date/Time: 04/11/2013 7:49 AM Performed by: Dannielle Huh Pre-anesthesia Checklist: Patient identified, Emergency Drugs available, Suction available and Patient being monitored Patient Re-evaluated:Patient Re-evaluated prior to inductionOxygen Delivery Method: Circle system utilized Preoxygenation: Pre-oxygenation with 100% oxygen Intubation Type: IV induction Ventilation: Mask ventilation without difficulty LMA: LMA inserted LMA Size: 4.0 Number of attempts: 1 Placement Confirmation: positive ETCO2 and breath sounds checked- equal and bilateral Tube secured with: Tape Dental Injury: Teeth and Oropharynx as per pre-operative assessment     Anesthesia Regional Block:  Femoral nerve block  Pre-Anesthetic Checklist: ,, timeout performed, Correct Patient, Correct Site, Correct Laterality, Correct Procedure, Correct Position, site marked, Risks and benefits discussed,  Surgical consent,  Pre-op evaluation,  At surgeon's request and post-op pain management  Laterality: Left  Prep: chloraprep       Needles:  Injection technique: Single-shot  Needle Type: Echogenic Stimulator Needle      Needle Gauge: 22 and 22 G    Additional Needles:  Procedures: nerve stimulator Femoral nerve block  Nerve Stimulator or Paresthesia:  Response: patella twitch, 0.45 mA, 0.1 ms,   Additional Responses:   Narrative:  Start time: 04/11/2013 7:15 AM End time: 04/11/2013 7:21 AM Injection made incrementally with aspirations every 5 mL.  Performed by: Personally  Anesthesiologist: Sandford Craze, MD  Additional Notes: Pt identified in Holding room.  Monitors applied. Working IV access confirmed. Sterile prep L groin.  #22ga PNS to patella twitch at 0.58mA threshold.  30cc 0.5% Bupivacaine with 1:200k epi injected incrementally after negative test dose.  Patient asymptomatic, VSS, no heme aspirated, tolerated well.  Sandford Craze, MD  Femoral nerve block

## 2013-04-11 NOTE — Progress Notes (Signed)
Pt has [employer] defib call placed to On call services, info given and Rep to return call.

## 2013-04-11 NOTE — Progress Notes (Addendum)
ANTICOAGULATION CONSULT NOTE - Initial Consult  Pharmacy Consult for Coumadin Indication: VTE prophylaxis, Coumadin PTA for Afib?  Allergies  Allergen Reactions  . Povidone Iodine Hives    Patient Measurements: 69 kg   Vital Signs: Temp: 97.9 F (36.6 C) (11/10 1250) Temp src: Oral (11/10 1119) BP: 141/63 mmHg (11/10 1250) Pulse Rate: 60 (11/10 1250)  Labs: No results found for this basename: HGB, HCT, PLT, APTT, LABPROT, INR, HEPARINUNFRC, CREATININE, CKTOTAL, CKMB, TROPONINI,  in the last 72 hours  CrCl is unknown because there is no height on file for the current visit.   Medical History: Past Medical History  Diagnosis Date  . CHF (congestive heart failure)   . Dysrhythmia     takes Coreg bid  . Hypertension     takes Diltiazem daily and Lisinopril bid  . Pacemaker   . ICD (implantable cardioverter-defibrillator) in place   . Neuropathy     left  . Shortness of breath     with exertion  . Arthritis   . Joint pain   . Joint swelling   . Peripheral edema     left leg  . Constipation   . History of colon polyps   . Urinary incontinence   . History of bladder infections     2months ago  . History of blood transfusion   . Low iron    Assessment: 72 year old beginning Coumadin for VTE prophylaxis s/p TKA On Coumadin PTA  Goal of Therapy:  INR 2-3 Monitor platelets by anticoagulation protocol: Yes   Plan:  1) Coumadin 6 mg po x 1 today 2) Daily INR 3) Coumadin education  Thank you. Okey Regal, PharmD [phone]  Elwin Sleight 04/11/2013,12:58 PM

## 2013-04-11 NOTE — Progress Notes (Signed)
Sara Underwood returned call and states he will be around to follow up.

## 2013-04-11 NOTE — Preoperative (Signed)
Beta Blockers   Reason not to administer Beta Blockers:Not Applicable, patient took Carvedilol 04/11/13. 

## 2013-04-11 NOTE — Evaluation (Signed)
Physical Therapy Evaluation Patient Details Name: Sara Underwood MRN: [id number] DOB: 1941/03/02 Today's Date: 04/11/2013 Time: 7829-5621 PT Time Calculation (min): 32 min  PT Assessment / Plan / Recommendation History of Present Illness  Patient is a 72 yo female s/p Lt TKA.  Patient with h/o LLE neuropathy with foot drop - wears brace.  Clinical Impression  Patient presents with problems listed below.  Will benefit from acute PT to maximize independence prior to discharge home with husband.    PT Assessment  Patient needs continued PT services    Follow Up Recommendations  Home health PT;Supervision/Assistance - 24 hour    Does the patient have the potential to tolerate intense rehabilitation      Barriers to Discharge        Equipment Recommendations  None recommended by PT    Recommendations for Other Services     Frequency 7X/week    Precautions / Restrictions Precautions Precautions: Knee;Fall Precaution Booklet Issued: Yes (comment) Precaution Comments: Reviewed precautions with patient and husband. Required Braces or Orthoses: Other Brace/Splint Other Brace/Splint: Wears LLE brace for foot drop.  Reports she has not been wearing it since she has had to wear a knee brace prior to surgery. Restrictions Weight Bearing Restrictions: Yes LLE Weight Bearing: Weight bearing as tolerated   Pertinent Vitals/Pain Pain 6/10 impacting mobility      Mobility  Bed Mobility Bed Mobility: Supine to Sit;Sitting - Scoot to Edge of Bed Supine to Sit: 4: Min assist;HOB elevated;With rails Sitting - Scoot to Edge of Bed: 4: Min assist Details for Bed Mobility Assistance: Verbal cues for technique.  Assist to move LLE off of bed and to raise trunk to sitting position.  Once upright, patient with good balance. Transfers Transfers: Sit to Stand;Stand to [employer] Transfers Sit to Stand: 4: Min assist;With upper extremity assist;From bed Stand to Sit: 4: Min assist;With  upper extremity assist;With armrests;To chair/3-in-1 Stand Pivot Transfers: 3: Mod assist Details for Transfer Assistance: Verbal cues for hand placement and safe use of RW.  Patient able to take a few steps to pivot to chair with mod assist due to nerve block and inability to bear weight on LLE.  Patient became nauseated once in chair.  RN notified.  Cleared with time. Ambulation/Gait Ambulation/Gait Assistance: Not tested (comment)    Exercises Total Joint Exercises Ankle Circles/Pumps: AROM;Both;10 reps;Seated (Difficulty with Lt ankle due to neuropathy)   PT Diagnosis: Difficulty walking;Generalized weakness;Acute pain  PT Problem List: Decreased strength;Decreased range of motion;Decreased activity tolerance;Decreased balance;Decreased mobility;Decreased knowledge of use of DME;Decreased knowledge of precautions;Pain PT Treatment Interventions: DME instruction;Gait training;Functional mobility training;Therapeutic exercise;Patient/family education     PT Goals(Current goals can be found in the care plan section) Acute Rehab PT Goals Patient Stated Goal: To be safe to go home. PT Goal Formulation: With patient/family Time For Goal Achievement: 04/18/13 Potential to Achieve Goals: Good  Visit Information  Last PT Received On: 04/11/13 Assistance Needed: +1 History of Present Illness: Patient is a 72 yo female s/p Lt TKA.  Patient with h/o LLE neuropathy with foot drop - wears brace.       Prior Functioning  Home Living Family/patient expects to be discharged to:: Private residence Living Arrangements: Spouse/significant other Available Help at Discharge: Family;Available 24 hours/day Type of Home: House Home Access: Level entry Home Layout: One level Home Equipment: Walker - 2 wheels;Cane - single point;Bedside commode;Shower seat - built in Prior Function Level of Independence: Independent with assistive device(s) (Uses cane)  Communication Communication: No difficulties     Cognition  Cognition Arousal/Alertness: Awake/alert Behavior During Therapy: WFL for tasks assessed/performed Overall Cognitive Status: Within Functional Limits for tasks assessed    Extremity/Trunk Assessment Upper Extremity Assessment Upper Extremity Assessment: Overall WFL for tasks assessed Lower Extremity Assessment Lower Extremity Assessment: LLE deficits/detail LLE Deficits / Details: Decreased strength and ROM due to surgery/pain.  Able to assist with moving LLE off of bed. LLE: Unable to fully assess due to pain LLE Sensation: history of peripheral neuropathy (Drop foot) LLE Coordination: decreased gross motor Cervical / Trunk Assessment Cervical / Trunk Assessment: Normal   Balance Balance Balance Assessed: Yes Static Sitting Balance Static Sitting - Balance Support: No upper extremity supported;Feet supported Static Sitting - Level of Assistance: 5: Stand by assistance Static Sitting - Comment/# of Minutes: 6 minutes with good balance Static Standing Balance Static Standing - Balance Support: Bilateral upper extremity supported Static Standing - Level of Assistance: 4: Min assist Static Standing - Comment/# of Minutes: 2 minutes   End of Session PT - End of Session Equipment Utilized During Treatment: Gait belt;Oxygen Activity Tolerance: Patient limited by pain;Patient limited by fatigue Patient left: in chair;with call bell/phone within reach;with family/visitor present Nurse Communication: Mobility status (Nausea) CPM Left Knee CPM Left Knee: Off  GP     Vena Austria 04/11/2013, 4:44 PM  Durenda Hurt. Renaldo Fiddler, [facility] Acute Rehab Services Pager [phone]

## 2013-04-12 LAB — BASIC METABOLIC PANEL
BUN: 11 mg/dL (ref 6–23)
CO2: 25 mEq/L (ref 19–32)
Calcium: 8.3 mg/dL — ABNORMAL LOW (ref 8.4–10.5)
Chloride: 103 mEq/L (ref 96–112)
Creatinine, Ser: 0.54 mg/dL (ref 0.50–1.10)
GFR calc Af Amer: >90 mL/min (ref >90)
GFR calc non Af Amer: >90 mL/min (ref >90)
Glucose, Bld: 120 mg/dL — ABNORMAL HIGH (ref 70–99)

## 2013-04-12 LAB — CBC
HCT: 28.4 % — ABNORMAL LOW (ref 36.0–46.0)
MCH: 30.5 pg (ref 26.0–34.0)
MCHC: 33.5 g/dL (ref 30.0–36.0)
MCV: 91.3 fL (ref 78.0–100.0)
RDW: 14.2 % (ref 11.5–15.5)

## 2013-04-12 LAB — PROTIME-INR: Prothrombin Time: 14.6 seconds (ref 11.6–15.2)

## 2013-04-12 MED ORDER — CELECOXIB 200 MG PO CAPS: 200.0000 mg | ORAL_CAPSULE | Freq: Two times a day (BID) | ORAL | Status: AC

## 2013-04-12 MED ORDER — OXYCODONE HCL 5 MG PO TABS: 5.0000 mg | ORAL_TABLET | ORAL | Status: DC | PRN

## 2013-04-12 MED ORDER — METHOCARBAMOL 500 MG PO TABS: 500.0000 mg | ORAL_TABLET | Freq: Four times a day (QID) | ORAL | Status: DC | PRN

## 2013-04-12 MED ORDER — WARFARIN SODIUM 6 MG PO TABS
6.0000 mg | ORAL_TABLET | Freq: Once | ORAL | Status: AC
  Administered 2013-04-12: 6 mg via ORAL
  Filled 2013-04-12: qty 1

## 2013-04-12 MED ORDER — ENOXAPARIN SODIUM 40 MG/0.4ML ~~LOC~~ SOLN: 40.0000 mg | SUBCUTANEOUS | Status: DC

## 2013-04-12 NOTE — Progress Notes (Signed)
SPORTS MEDICINE AND JOINT REPLACEMENT  Georgena Spurling, MD   Altamese Cabal, PA-C [address] [place], [place], [place]  [address]                             [phone]   PROGRESS NOTE  Subjective:  negative for Chest Pain  negative for Shortness of Breath  negative for Nausea/Vomiting   negative for Calf Pain  negative for Bowel Movement   Tolerating Diet: yes         Patient reports pain as 5 on 0-10 scale.    Objective: Vital signs in last 24 hours:   Patient Vitals for the past 24 hrs:  BP Temp Temp src Pulse Resp SpO2  04/12/13 1151 - - - - 18 -  04/12/13 1125 129/66 mmHg - - - - -  04/12/13 1112 104/58 mmHg - - - - -  04/12/13 1044 144/70 mmHg - - - - -  04/12/13 0800 - - - - 18 -  04/12/13 0716 102/50 mmHg 98.5 F (36.9 C) Oral 75 18 99 %  04/12/13 0146 141/75 mmHg 98.8 F (37.1 C) Oral 75 18 99 %  04/11/13 2102 139/66 mmHg 98.6 F (37 C) Oral 73 18 98 %  04/11/13 1800 123/56 mmHg 98 F (36.7 C) Oral 63 18 97 %  04/11/13 1600 - - - - 18 -  04/11/13 1250 141/63 mmHg 97.9 F (36.6 C) - 60 16 97 %    @flow {1959:LAST@   Intake/Output from previous day:   11/10 0701 - 11/11 0700 In: 1790 [P.O.:240; I.V.:1550] Out: 2790 [Urine:2250; Drains:240]   Intake/Output this shift:   11/11 0701 - 11/11 1900 In: 240 [P.O.:240] Out: 700 [Urine:600; Drains:100]   Intake/Output     11/10 0701 - 11/11 0700 11/11 0701 - 11/12 0700   P.O. 240 240   I.V. 1550    Total Intake 1790 240   Urine 2250 600   Drains 240 100   Blood 300    Total Output 2790 700   Net -1000 -460           LABORATORY DATA:  Recent Labs  04/11/13 1315 04/12/13 0512  WBC 11.5* 7.6  HGB 10.7* 9.5*  HCT 31.7* 28.4*  PLT 219 196    Recent Labs  04/11/13 1315 04/12/13 0512  NA  --  135  K  --  3.9  CL  --  103  CO2  --  25  BUN  --  11  CREATININE 0.57 0.54  GLUCOSE  --  120*  CALCIUM  --  8.3*   Lab Results  Component Value Date   INR 1.16 04/12/2013   INR 1.85* 04/04/2013     Examination:  General appearance: alert, cooperative and no distress Extremities: extremities normal, atraumatic, no cyanosis or edema and Homans sign is negative, no sign of DVT  Wound Exam: clean, dry, intact   Drainage:  Scant/small amount Serosanguinous exudate  Motor Exam: EHL and FHL Intact  Sensory Exam: Deep Peroneal normal   Assessment:    1 Day Post-Op  Procedure(s) (LRB): LEFT TOTAL KNEE ARTHROPLASTY (Left)  ADDITIONAL DIAGNOSIS:  Active Problems:   * No active hospital problems. *  Acute Blood Loss Anemia   Plan: Physical Therapy as ordered Weight Bearing as Tolerated (WBAT)  DVT Prophylaxis:  Lovenox and Coumadin  DISCHARGE PLAN: Home  DISCHARGE NEEDS: HHPT, CPM, Walker and 3-in-1 comode seat  Gabriela Giannelli 04/12/2013, 12:08 PM

## 2013-04-12 NOTE — Progress Notes (Signed)
Physical Therapy Treatment Patient Details Name: Sara Underwood MRN: [id number] DOB: 05-Jul-1940 Today's Date: 04/12/2013 Time: 9528-4132 PT Time Calculation (min): 37 min  PT Assessment / Plan / Recommendation  History of Present Illness Patient is a 72 yo female s/p Lt TKA.  Patient with h/o LLE neuropathy with foot drop - wears brace.   PT Comments   Pt has ambulated household distance safely, improving from am session, especially from an activity tolerance standpoint; Pt would feel more confident with one more PT session before d/c, and PT agrees given BP drop earlier today; Definitely feel pt will be ready for dc tomorrow   Follow Up Recommendations  Home health PT;Supervision/Assistance - 24 hour     Does the patient have the potential to tolerate intense rehabilitation     Barriers to Discharge        Equipment Recommendations  None recommended by PT    Recommendations for Other Services    Frequency 7X/week   Progress towards PT Goals Progress towards PT goals: Progressing toward goals  Plan Current plan remains appropriate    Precautions / Restrictions Precautions Precautions: Knee;Fall Required Braces or Orthoses: Other Brace/Splint Other Brace/Splint: Wears LLE brace for foot drop.  Reports she has not been wearing it since she has had to wear a knee brace prior to surgery. Restrictions Weight Bearing Restrictions: Yes LLE Weight Bearing: Weight bearing as tolerated   Pertinent Vitals/Pain BP was normal supine (121/79); BP was normal standing after walking (116/68). 2/10 L knee with amb    Mobility  Bed Mobility Bed Mobility: Supine to Sit Supine to Sit: 5: Supervision Transfers Transfers: Sit to Stand;Stand to Sit Sit to Stand: 4: Min guard;From bed;With upper extremity assist Stand to Sit: 4: Min assist;To chair/3-in-1;With armrests Details for Transfer Assistance: verbal cues for hand placement for stand to sit.  Ambulation/Gait Ambulation/Gait  Assistance: 4: Min guard Ambulation Distance (Feet): 80 Feet Assistive device: Rolling walker Ambulation/Gait Assistance Details: no dizziness with walking. Good L stance stability Gait Pattern: Step-through pattern Gait velocity: slow General Gait Details: Pt progressed from step to to step through with cueing and enouragement.     Exercises General Exercises - Lower Extremity Quad Sets: Left;AROM;Standing   PT Diagnosis:    PT Problem List:   PT Treatment Interventions:     PT Goals (current goals can now be found in the care plan section) Acute Rehab PT Goals Patient Stated Goal: To walk in order to safely go home PT Goal Formulation: With patient Time For Goal Achievement: 04/18/13 Potential to Achieve Goals: Good  Visit Information  Last PT Received On: 04/12/13 Assistance Needed: +1 History of Present Illness: Patient is a 72 yo female s/p Lt TKA.  Patient with h/o LLE neuropathy with foot drop - wears brace.    Subjective Data  Patient Stated Goal: To walk in order to safely go home   Cognition  Cognition Arousal/Alertness: Awake/alert Behavior During Therapy: WFL for tasks assessed/performed Overall Cognitive Status: Within Functional Limits for tasks assessed    Balance  Balance Balance Assessed: Yes Static Standing Balance Static Standing - Balance Support: Left upper extremity supported;During functional activity (stood with one hand on RW while BP was taken standing) Static Standing - Level of Assistance: 5: Stand by assistance (contact guard) Static Standing - Comment/# of Minutes: 3  End of Session PT - End of Session Equipment Utilized During Treatment: Gait belt Activity Tolerance: Patient tolerated treatment well Patient left: in chair;with call bell/phone within  reach   GP     Barbaraann Boys 04/12/2013, 4:30 PM  Van Clines, [place] [phone]

## 2013-04-12 NOTE — Evaluation (Signed)
Occupational Therapy Evaluation Patient Details Name: Sara Underwood MRN: [id number] DOB: [birth year] Today's Date: 04/12/2013 Time: 4098-1191 OT Time Calculation (min): 26 min  OT Assessment / Plan / Recommendation History of present illness Patient is a 72 yo female s/p Lt TKA.  Patient with h/o LLE neuropathy with foot drop - wears brace.   Clinical Impression   Pt would benefit from continued skilled OT services to continue to advance her functional ambulation with self care tasks, decreased fall risk with mobility and increase her independence with LB ADLs.     OT Assessment  Patient needs continued OT Services    Follow Up Recommendations  No OT follow up    Barriers to Discharge      Equipment Recommendations       Recommendations for Other Services    Frequency       Precautions / Restrictions Precautions Precautions: Knee;Fall Precaution Comments: Reviewed precautions with patient and husband. Other Brace/Splint: Wears LLE brace for foot drop.  Reports she has not been wearing it since she has had to wear a knee brace prior to surgery. Restrictions LLE Weight Bearing: Weight bearing as tolerated   Pertinent Vitals/Pain 3/10  Ice added to knee after session and recommended wearing for 20 min to decr swelling    ADL  Eating/Feeding: Performed;Independent Grooming: Performed;Supervision/safety Lower Body Dressing: Simulated;Moderate assistance Toilet Transfer: Simulated;Moderate assistance Toilet Transfer Method: Sit to stand;Stand pivot Transfers/Ambulation Related to ADLs: Pt able to come to EOB with min A with min use of bed rails, sit to stand with mod A, able to perform short distance functional ambulation from bed to recliner with min A with verbal cues for weight shifts and taking a bigger step with right LE. Pt able to tolerate taking weight through her left LE with pain no greater than a 3 Pt continues to present wtih left foot drop and a leg length  discerpecy.  Husband present for evaulation  ADL Comments: Educated on method for shower stall transfer with RW - entering the shower backwards     OT Diagnosis: Generalized weakness  OT Problem List: Decreased strength;Decreased activity tolerance;Decreased range of motion;Decreased coordination;Impaired balance (sitting and/or standing) OT Treatment Interventions: Self-care/ADL training;Therapeutic exercise;DME and/or AE instruction;Therapeutic activities;Modalities;Balance training;Patient/family education   OT Goals(Current goals can be found in the care plan section) Acute Rehab OT Goals Patient Stated Goal: To be safe to go home. OT Goal Formulation: With patient/family Time For Goal Achievement: 04/19/13 Potential to Achieve Goals: Good  Visit Information  Last OT Received On: 04/12/13 Assistance Needed: +1 History of Present Illness: Patient is a 72 yo female s/p Lt TKA.  Patient with h/o LLE neuropathy with foot drop - wears brace.       Prior Functioning     Home Living Family/patient expects to be discharged to:: Private residence Living Arrangements: Spouse/significant other Available Help at Discharge: Family;Available 24 hours/day Type of Home: House Home Access: Level entry Home Layout: One level Home Equipment: Walker - 2 wheels;Cane - single point;Bedside commode;Shower seat - built in Prior Function Level of Independence: Independent with assistive device(s) Communication Communication: No difficulties         Vision/Perception Vision - History Baseline Vision: No visual deficits Patient Visual Report: No change from baseline Vision - Assessment Eye Alignment: Within Functional Limits Perception Perception: Within Functional Limits Praxis Praxis: Intact   Cognition  Cognition Arousal/Alertness: Awake/alert Behavior During Therapy: WFL for tasks assessed/performed Overall Cognitive Status: Within Functional Limits for tasks assessed  Extremity/Trunk Assessment Upper Extremity Assessment Upper Extremity Assessment: Overall WFL for tasks assessed Lower Extremity Assessment LLE Deficits / Details: Decreased strength and ROM due to surgery/pain.   LLE Sensation: history of peripheral neuropathy LLE Coordination: decreased gross motor Cervical / Trunk Assessment Cervical / Trunk Assessment: Normal     Mobility Bed Mobility Bed Mobility: Supine to Sit;Sitting - Scoot to Edge of Bed Supine to Sit: 4: Min assist;With rails Sitting - Scoot to [place] of Bed: 4: Min assist Transfers Sit to Stand: 4: Min assist;3: Mod assist Stand to Sit: 4: Min assist Details for Transfer Assistance: Verbal cues for hand placement and safe use of RW.         Exercise     Balance Static Sitting Balance Static Sitting - Level of Assistance: 6: Modified independent (Device/Increase time) Static Standing Balance Static Standing - Level of Assistance: 4: Min assist   End of Session OT - End of Session Equipment Utilized During Treatment: Rolling walker Activity Tolerance: Patient tolerated treatment well Patient left: in chair;with call bell/phone within reach;with family/visitor present Nurse Communication: Mobility status  GO     Adan Sis 04/12/2013, 9:39 AM

## 2013-04-12 NOTE — Progress Notes (Signed)
04/12/13 Patient chose [facility] for [facility]. Contacted Nikki at [facility], set up HHPT and [facility] for coumadin monitoring. Faxed facesheet, order,H and P and PT note to [phone] and received confirmation. [employer] providing CPM, patient has rolling walker and 3N1.Will continue to follow for d/c needs. Jacquelynn Cree RN, BSN, CCM

## 2013-04-12 NOTE — Progress Notes (Signed)
ANTICOAGULATION CONSULT NOTE  Pharmacy Consult for Coumadin Indication: VTE prophylaxis, Coumadin PTA for Afib?  Allergies  Allergen Reactions  . Povidone Iodine Hives    Patient Measurements: 69 kg  Labs:  Recent Labs  04/11/13 1315 04/12/13 0512  HGB 10.7* 9.5*  HCT 31.7* 28.4*  PLT 219 196  LABPROT  --  14.6  INR  --  1.16  CREATININE 0.57 0.54    CrCl is unknown because there is no height on file for the current visit.  Assessment: 72 year old beginning Coumadin for VTE prophylaxis s/p TKA On Coumadin PTA  Goal of Therapy:  INR 2-3 Monitor platelets by anticoagulation protocol: Yes   Plan:  1) Coumadin 6 mg po x 1 today 2) Daily INR  Thank you. Okey Regal, PharmD [phone]  Elwin Sleight 04/12/2013,11:01 AM

## 2013-04-12 NOTE — Progress Notes (Signed)
Physical Therapy Treatment Patient Details Name: RHEANNA SERGENT MRN: [id number] DOB: [birth year] Today's Date: 04/12/2013 Time: 4098-1191 PT Time Calculation (min): 47 min  PT Assessment / Plan / Recommendation  History of Present Illness Patient is a 72 yo female s/p Lt TKA.  Patient with h/o LLE neuropathy with foot drop - wears brace.   PT Comments   Pt was able to stand and walk WBAT with RW, min assist. Not limited by fatigue or weakness. Limited by feeling of dizziness and low blood pressure. BP was measured initially, found to be slightly high but within normal for patient. Pt felt dizzy after about 15 feet of walking, assisted to sitting, BP was low, but was returning to normal. See vitals flow sheet.   Pt is motivated to walk and return home. On track for dc home tomorrow. Instructed pt to self-monitor for activity tolerance with upright activity   Follow Up Recommendations  Home health PT;Supervision/Assistance - 24 hour     Does the patient have the potential to tolerate intense rehabilitation     Barriers to Discharge        Equipment Recommendations  Rolling walker with 5" wheels    Recommendations for Other Services    Frequency 7X/week   Progress towards PT Goals Progress towards PT goals: Progressing toward goals  Plan Current plan remains appropriate    Precautions / Restrictions Precautions Precautions: Knee;Fall Precaution Comments: Reviewed precautions with patient and husband. Required Braces or Orthoses: Other Brace/Splint Other Brace/Splint: Wears LLE brace for foot drop.  Reports she has not been wearing it since she has had to wear a knee brace prior to surgery. Restrictions Weight Bearing Restrictions: Yes LLE Weight Bearing: Weight bearing as tolerated   Pertinent Vitals/Pain BP dropped with walking, began returning to normal with reclined sitting and rest. See vitals flow sheet.  Reported minimal pain during session     Mobility    Transfers Sit to Stand: 4: Min assist;With armrests;From chair/3-in-1 Stand to Sit: 4: Min assist;To chair/3-in-1;With armrests Details for Transfer Assistance: verbal cues for hand placement for stand to sit.  Ambulation/Gait Ambulation/Gait Assistance: 4: Min assist Ambulation Distance (Feet): 15 Feet Assistive device: Rolling walker Ambulation/Gait Assistance Details: pt walked WBAT with rolling walker. Good strenght in LLE and UE. Distance ambulated/activity tolerance limited by dizziness.  Gait Pattern: Step-through pattern;Step-to pattern Gait velocity: slow General Gait Details: Pt progressed from step to to step through with cueing and enouragement.     Exercises General Exercises - Lower Extremity Quad Sets: AROM;10 reps;Both;Seated Long Arc Quad: Left;AROM;5 reps;Seated   PT Diagnosis:    PT Problem List:   PT Treatment Interventions:     PT Goals (current goals can now be found in the care plan section) Acute Rehab PT Goals Patient Stated Goal: To walk in order to safely go home PT Goal Formulation: With patient/family Time For Goal Achievement: 04/18/13 Potential to Achieve Goals: Good  Visit Information  Last PT Received On: 04/12/13 Assistance Needed: +1 History of Present Illness: Patient is a 72 yo female s/p Lt TKA.  Patient with h/o LLE neuropathy with foot drop - wears brace.    Subjective Data  Patient Stated Goal: To walk in order to safely go home   Cognition  Cognition Arousal/Alertness: Awake/alert Behavior During Therapy: WFL for tasks assessed/performed Overall Cognitive Status: Within Functional Limits for tasks assessed    Balance  Balance Balance Assessed: Yes Static Sitting Balance Static Sitting - Level of Assistance: 6: Modified  independent (Device/Increase time) Static Standing Balance Static Standing - Balance Support: Bilateral upper extremity supported;During functional activity Static Standing - Level of Assistance: 5: Stand by  assistance (contact guard assistance. ) Static Standing - Comment/# of Minutes: 2  End of Session PT - End of Session Equipment Utilized During Treatment: Gait belt Activity Tolerance: Other (comment) (pt limited by dizziness/low blood pressure) Patient left: in chair;with call bell/phone within reach;with family/visitor present   GP     Barbaraann Boys 04/12/2013, 12:04 PM  Van Clines, PT [phone]

## 2013-04-13 ENCOUNTER — Encounter (HOSPITAL_COMMUNITY): Payer: Self-pay | Admitting: Orthopedic Surgery

## 2013-04-13 LAB — CBC
HCT: 26.6 % — ABNORMAL LOW (ref 36.0–46.0)
MCH: 30.3 pg (ref 26.0–34.0)
MCV: 92.7 fL (ref 78.0–100.0)
Platelets: 179 10*3/uL (ref 150–400)
RBC: 2.87 MIL/uL — ABNORMAL LOW (ref 3.87–5.11)
RDW: 14.1 % (ref 11.5–15.5)

## 2013-04-13 LAB — BASIC METABOLIC PANEL
BUN: 15 mg/dL (ref 6–23)
CO2: 25 mEq/L (ref 19–32)
Calcium: 8.5 mg/dL (ref 8.4–10.5)
Creatinine, Ser: 0.62 mg/dL (ref 0.50–1.10)
GFR calc Af Amer: >90 mL/min (ref >90)
GFR calc non Af Amer: 87 mL/min — ABNORMAL LOW (ref >90)
Sodium: 137 mEq/L (ref 135–145)

## 2013-04-13 LAB — PROTIME-INR: Prothrombin Time: 19 seconds — ABNORMAL HIGH (ref 11.6–15.2)

## 2013-04-13 MED ORDER — WARFARIN SODIUM 3 MG PO TABS
3.0000 mg | ORAL_TABLET | Freq: Once | ORAL | Status: DC
  Filled 2013-04-13: qty 1

## 2013-04-13 NOTE — Op Note (Signed)
TOTAL KNEE REPLACEMENT OPERATIVE NOTE:  04/11/2013  7:34 AM  PATIENT:  Sara Underwood  72 y.o. female  PRE-OPERATIVE DIAGNOSIS:  osteoarthritis left knee  POST-OPERATIVE DIAGNOSIS:  osteoarthritis left knee  PROCEDURE:  Procedure(s): LEFT TOTAL KNEE ARTHROPLASTY  SURGEON:  Surgeon(s): Dannielle Huh, MD  PHYSICIAN ASSISTANT: Altamese Cabal, [facility]  ANESTHESIA:   general  DRAINS: Hemovac  SPECIMEN: None  COUNTS:  Correct  TOURNIQUET:   Total Tourniquet Time Documented: Thigh (Left) - 55 minutes Total: Thigh (Left) - 55 minutes   DICTATION:  Indication for procedure:    The patient is a 72 y.o. female who has failed conservative treatment for osteoarthritis left knee.  Informed consent was obtained prior to anesthesia. The risks versus benefits of the operation were explain and in a way the patient can, and did, understand.   On the implant demand matching protocol, this patient scored 10.  Therefore, this patient was not receive a polyethylene insert with vitamin E which is a high demand implant.  Description of procedure:     The patient was taken to the operating room and placed under anesthesia.  The patient was positioned in the usual fashion taking care that all body parts were adequately padded and/or protected.  I foley catheter was placed.  A tourniquet was applied and the leg prepped and draped in the usual sterile fashion.  The extremity was exsanguinated with the esmarch and tourniquet inflated to 350 mmHg.  Pre-operative range of motion was normal.  The knee was in 5 degree of mild varus.  A midline incision approximately 6-7 inches long was made with a #10 blade.  A new blade was used to make a parapatellar arthrotomy going 2-3 cm into the quadriceps tendon, over the patella, and alongside the medial aspect of the patellar tendon.  A synovectomy was then performed with the #10 blade and forceps. I then elevated the deep MCL off the medial tibial metaphysis  subperiosteally around to the semimembranosus attachment.    I everted the patella and used calipers to measure patellar thickness.  I used the reamer to ream down to appropriate thickness to recreate the native thickness.  I then removed excess bone with the rongeur and sagittal saw.  I used the appropriately sized template and drilled the three lug holes.  I then put the trial in place and measured the thickness with the calipers to ensure recreation of the native thickness.  The trial was then removed and the patella subluxed and the knee brought into flexion.  A homan retractor was place to retract and protect the patella and lateral structures.  A Z-retractor was place medially to protect the medial structures.  The extra-medullary alignment system was used to make cut the tibial articular surface perpendicular to the anamotic axis of the tibia and in 3 degrees of posterior slope.  The cut surface and alignment jig was removed.  I then used the intramedullary alignment guide to make a 6 valgus cut on the distal femur.  I then marked out the epicondylar axis on the distal femur.  The posterior condylar axis measured 5 degrees.  I then used the anterior referencing sizer and measured the femur to be a size 8.  The 4-In-1 cutting block was screwed into place in external rotation matching the posterior condylar angle, making our cuts perpendicular to the epicondylar axis.  Anterior, posterior and chamfer cuts were made with the sagittal saw.  The cutting block and cut pieces were removed.  A  lamina spreader was placed in 90 degrees of flexion.  The ACL, PCL, menisci, and posterior condylar osteophytes were removed.  A 10 mm spacer blocked was found to offer good flexion and extension gap balance after minimal in degree releasing.   The scoop retractor was then placed and the femoral finishing block was pinned in place.  The small sagittal saw was used as well as the lug drill to finish the femur.  The block  and cut surfaces were removed and the medullary canal hole filled with autograft bone from the cut pieces.  The tibia was delivered forward in deep flexion and external rotation.  A size D tray was selected and pinned into place centered on the medial 1/3 of the tibial tubercle.  The reamer and keel was used to prepare the tibia through the tray.    I then trialed with the size 8 femur, size D tibia, a 10 mm insert and the 32 patella.  I had excellent flexion/extension gap balance, excellent patella tracking.  Flexion was full and beyond 120 degrees; extension was zero.  These components were chosen and the staff opened them to me on the back table while the knee was lavaged copiously and the cement mixed.  The soft tissue was infiltrated with 60cc of exparel 1.3% through a 21 gauge needle.  I cemented in the components and removed all excess cement.  The polyethylene tibial component was snapped into place and the knee placed in extension while cement was hardening.  The capsule was infilltrated with 30cc of .25% Marcaine with epinephrine.  A hemovac was place in the joint exiting superolaterally.  A pain pump was place superomedially superficial to the arthrotomy.  Once the cement was hard, the tourniquet was let down.  Hemostasis was obtained.  The arthrotomy was closed with figure-8 #1 vicryl sutures.  The deep soft tissues were closed with #0 vicryls and the subcuticular layer closed with a running #2-0 vicryl.  The skin was reapproximated and closed with skin staples.  The wound was dressed with xeroform, 4 x4's, 2 ABD sponges, a single layer of webril and a TED stocking.   The patient was then awakened, extubated, and taken to the recovery room in stable condition.  BLOOD LOSS:  300cc DRAINS: 1 hemovac, 1 pain catheter COMPLICATIONS:  None.  PLAN OF CARE: Admit to inpatient   PATIENT DISPOSITION:  PACU - hemodynamically stable.   Delay start of Pharmacological VTE agent (>24hrs) due to  surgical blood loss or risk of bleeding:  not applicable  Please fax a copy of this op note to my office at [phone] (please only include page 1 and 2 of the Case Information op note)

## 2013-04-13 NOTE — Progress Notes (Signed)
04/13/13 Contacted Nikki at [facility] and informed her that patient is being discharged today.They will see patient on 04/14/13.  She requested d/c summary be faxed to them. Will fax d/c summary once it is available. Jacquelynn Cree RN, BSN, CCM  04/12/13 Patient chose [facility] for [facility]. Contacted Nikki at [facility], set up HHPT and [facility] for coumadin monitoring. Faxed facesheet, order,H and P and PT note to [phone] and received confirmation. [employer] providing CPM, patient has rolling walker and 3N1.Will continue to follow for d/c needs. Jacquelynn Cree RN, BSN, CCM

## 2013-04-13 NOTE — Progress Notes (Signed)
ANTICOAGULATION CONSULT NOTE - Follow Up Consult  Pharmacy Consult for coumadin Indication: VTE prophylaxis and afib  Allergies  Allergen Reactions  . Povidone Iodine Hives   Vital Signs: Temp: 98.9 F (37.2 C) (11/12 0640) Temp src: Oral (11/12 0640) BP: 121/56 mmHg (11/12 1018) Pulse Rate: 67 (11/12 0640)  Labs:  Recent Labs  04/11/13 1315 04/12/13 0512 04/13/13 0455  HGB 10.7* 9.5* 8.7*  HCT 31.7* 28.4* 26.6*  PLT 219 196 179  LABPROT  --  14.6 19.0*  INR  --  1.16 1.64*  CREATININE 0.57 0.54 0.62    CrCl is unknown because there is no height on file for the current visit.  Assessment: Patient is a 72 y.o F on coumadin PTA for afib.  S/p left TKA with coumadin resumed on 11/10.  INR is subtherapeutic but increased noticeably from 1.16 to 1.64 after receiving 6mg  dose for the past 2 days.  Per patient, home regimen is 3mg  daily except 6mg  on Sat, Sun, and wed.  No bleeding documented.  Goal of Therapy:  INR 2-3 Monitor platelets by anticoagulation protocol: Yes   Plan:  1) coumadin 3mg  PO x1 today   Sara Underwood P 04/13/2013,1:29 PM

## 2013-04-13 NOTE — Progress Notes (Signed)
Physical Therapy Treatment Patient Details Name: GARRETT BOWRING MRN: [id number] DOB: 1941/05/27 Today's Date: 04/13/2013 Time: 4098-1191 PT Time Calculation (min): 63 min  PT Assessment / Plan / Recommendation  History of Present Illness Patient is a 72 yo female s/p Lt TKA.  Patient with h/o LLE neuropathy with foot drop - wears brace.   PT Comments   BP remained normal throughout session (130/56 resting (sitting), 130/68 after activity (supine)). Pt feels more confident about going home today. PT and SPT agree. Pt ambulated well about 110 feet and shows good self monitoring of dizziness (takes time to sit between supine and standing, monitors during activity). Pt mentioned concern over getting enough PT and intense enough PT upon d/c. Encourage pt to seek outpatient PT for knee and foot drop when HHPT is complete.     Follow Up Recommendations  Home health PT;Supervision/Assistance - 24 hour     Does the patient have the potential to tolerate intense rehabilitation     Barriers to Discharge        Equipment Recommendations  None recommended by PT    Recommendations for Other Services    Frequency 7X/week   Progress towards PT Goals Progress towards PT goals: Progressing toward goals  Plan Current plan remains appropriate    Precautions / Restrictions Precautions Precautions: Knee;Fall Required Braces or Orthoses: Other Brace/Splint Other Brace/Splint: Wears LLE brace for foot drop.  Reports she has not been wearing it since she has had to wear a knee brace prior to surgery. Restrictions Weight Bearing Restrictions: Yes LLE Weight Bearing: Weight bearing as tolerated   Pertinent Vitals/Pain BP at start of session 130/56 (sitting); after activity 130/68 (supine).  Pain 2/10 RN provided medication to assist with pain control.      Mobility  Bed Mobility Bed Mobility: Sit to Supine;Supine to Sit Supine to Sit: 5: Supervision Sitting - Scoot to Edge of Bed: 7:  Independent Sit to Supine: 5: Supervision Details: cues for technique Transfers Transfers: Sit to Stand;Stand to Sit Sit to Stand: With upper extremity assist;From bed;5: Supervision Stand to Sit: With upper extremity assist;To bed;To chair/3-in-1;5: Supervision Details: verbal cues for safe hand placement  Ambulation/Gait Ambulation/Gait Assistance: 5: Supervision Ambulation Distance (Feet): 110 Feet Assistive device: Rolling walker Ambulation/Gait Assistance Details: no dizziness with walking Gait Pattern: Step-through pattern;Step-to pattern (step through pattern with verbal reminders often) Gait velocity: slow     Exercises General Exercises - Lower Extremity Quad Sets: AROM;Left;10 reps;Supine Short Arc Quad: AROM;Left;10 reps;Supine Heel Slides: 10 reps;Left;AROM;Supine Straight Leg Raises: 10 reps;AROM;Left;Supine (some quad lag)   PT Diagnosis:    PT Problem List:   PT Treatment Interventions:     PT Goals (current goals can now be found in the care plan section) Acute Rehab PT Goals Patient Stated Goal: To walk in order to safely go home PT Goal Formulation: With patient Time For Goal Achievement: 04/18/13 Potential to Achieve Goals: Good  Visit Information  Last PT Received On: 04/13/13 Assistance Needed: +1 History of Present Illness: Patient is a 72 yo female s/p Lt TKA.  Patient with h/o LLE neuropathy with foot drop - wears brace.    Subjective Data  Patient Stated Goal: To walk in order to safely go home   Cognition  Cognition Arousal/Alertness: Awake/alert Behavior During Therapy: WFL for tasks assessed/performed Overall Cognitive Status: Within Functional Limits for tasks assessed    Balance  Balance Balance Assessed: Yes Static Sitting Balance Static Sitting - Balance Support: No upper extremity  supported Static Sitting - Level of Assistance: 7: Independent Static Sitting - Comment/# of Minutes: 1 Static Standing Balance Static Standing -  Balance Support: Bilateral upper extremity supported;During functional activity Static Standing - Level of Assistance: 6: Modified independent (Device/Increase time) (RW) Static Standing - Comment/#of Minutes: Standing at sink for 3 min  End of Session PT - End of Session Equipment Utilized During Treatment: Gait belt Activity Tolerance: Patient tolerated treatment well Patient left: in chair;with call bell/phone within reach   GP     Barbaraann Boys 04/13/2013, 12:03 PM  [place], [place] [phone]

## 2013-04-14 NOTE — Discharge Summary (Signed)
SPORTS MEDICINE & JOINT REPLACEMENT   Sara Spurling, MD   Sara Cabal, PA-C [address] [place], [place], [place]  [address]                             [phone]  PATIENT ID: Sara Underwood        MRN:  [id number]          DOB/AGE: December 22, 1940 / 72 y.o.    DISCHARGE SUMMARY  ADMISSION DATE:    04/11/2013 DISCHARGE DATE:  04/13/2013    ADMISSION DIAGNOSIS: osteoarthritis left knee    DISCHARGE DIAGNOSIS:  osteoarthritis left knee    ADDITIONAL DIAGNOSIS: Active Problems:   * No active hospital problems. *  Past Medical History  Diagnosis Date  . CHF (congestive heart failure)   . Dysrhythmia     takes Coreg bid  . Hypertension     takes Diltiazem daily and Lisinopril bid  . Pacemaker   . Neuropathy     left  . Joint pain   . Joint swelling   . Peripheral edema     left leg  . Constipation   . History of colon polyps   . Urinary incontinence   . History of bladder infections ~ 01/2013  . History of blood transfusion     "when I had ovary removed" (04/11/2013)  . Low iron   . Automatic implantable cardioverter-defibrillator in situ   . Exertional shortness of breath   . Osteoarthritis     "some in my back; in my left knee" (04/11/2013)    PROCEDURE: Procedure(s): LEFT TOTAL KNEE ARTHROPLASTY on 04/11/2013  CONSULTS:     HISTORY:  See H&P in chart  HOSPITAL COURSE:  Sara Underwood is a 72 y.o. admitted on 04/11/2013 and found to have a diagnosis of osteoarthritis left knee.  After appropriate laboratory studies were obtained  they were taken to the operating room on 04/11/2013 and underwent Procedure(s): LEFT TOTAL KNEE ARTHROPLASTY.   They were given perioperative antibiotics:  Anti-infectives   Start     Dose/Rate Route Frequency Ordered Stop   04/11/13 1300  ceFAZolin (ANCEF) IVPB 1 g/50 mL premix     1 g 100 mL/hr over 30 Minutes Intravenous Every 6 hours 04/11/13 1123 04/11/13 2022   04/11/13 0600  ceFAZolin (ANCEF) IVPB 2 g/50 mL premix     2  g 100 mL/hr over 30 Minutes Intravenous On call to O.R. 04/10/13 1409 04/11/13 0750    .  Tolerated the procedure well.  Placed with a foley intraoperatively.  Given Ofirmev at induction and for 48 hours.    POD# 1: Vital signs were stable.  Patient denied Chest pain, shortness of breath, or calf pain.  Patient was started on Lovenox 30 mg subcutaneously twice daily at 8am.  Consults to PT, OT, and care management were made.  The patient was weight bearing as tolerated.  CPM was placed on the operative leg 0-90 degrees for 6-8 hours a day.  Incentive spirometry was taught.  Dressing was changed.  Marcaine pump and hemovac were discontinued.      POD #2, Continued  PT for ambulation and exercise program.  IV saline locked.  O2 discontinued.    The remainder of the hospital course was dedicated to ambulation and strengthening.   The patient was discharged on2 days post op in  Good condition.  Blood products given:none  DIAGNOSTIC STUDIES: Recent vital signs: Patient  Vitals for the past 24 hrs:  BP  04/13/13 1018 121/56 mmHg  04/13/13 1017 121/56 mmHg       Recent laboratory studies:  Recent Labs  04/11/13 1315 04/12/13 0512 04/13/13 0455  WBC 11.5* 7.6 8.4  HGB 10.7* 9.5* 8.7*  HCT 31.7* 28.4* 26.6*  PLT 219 196 179    Recent Labs  04/11/13 1315 04/12/13 0512 04/13/13 0455  NA  --  135 137  K  --  3.9 4.0  CL  --  103 103  CO2  --  25 25  BUN  --  11 15  CREATININE 0.57 0.54 0.62  GLUCOSE  --  120* 115*  CALCIUM  --  8.3* 8.5   Lab Results  Component Value Date   INR 1.64* 04/13/2013   INR 1.16 04/12/2013   INR 1.85* 04/04/2013     Recent Radiographic Studies :  Dg Chest 2 View  04/04/2013   CLINICAL DATA:  preop  EXAM: CHEST  2 VIEW  COMPARISON:  None.  FINDINGS: Left-sided pacemaker overlies normal cardiac silhouette. No effusion, infiltrate, pneumothorax. No acute osseous abnormality.  IMPRESSION: No acute cardiopulmonary process.   Electronically Signed    By: Genevive Bi M.D.   On: 04/04/2013 16:02    DISCHARGE INSTRUCTIONS: Discharge Orders   Future Orders Complete By Expires   Call MD / Call 911  As directed    Comments:     If you experience chest pain or shortness of breath, CALL 911 and be transported to the hospital emergency room.  If you develope a fever above 101 F, pus (white drainage) or increased drainage or redness at the wound, or calf pain, call your surgeon's office.   Change dressing  As directed    Comments:     Change dressing on Thursday, then change the dressing daily with sterile 4 x 4 inch gauze dressing and apply TED hose.   Constipation Prevention  As directed    Comments:     Drink plenty of fluids.  Prune juice may be helpful.  You may use a stool softener, such as Colace (over the counter) 100 mg twice a day.  Use MiraLax (over the counter) for constipation as needed.   CPM  As directed    Comments:     Continuous passive motion machine (CPM):      Use the CPM from 0 to 90 for 6-8 hours per day.      You may increase by 10 per day.  You may break it up into 2 or 3 sessions per day.      Use CPM for 2 weeks or until you are told to stop.   Diet - low sodium heart healthy  As directed    Do not put a pillow under the knee. Place it under the heel.  As directed    Driving restrictions  As directed    Comments:     No driving for 6 weeks   Increase activity slowly as tolerated  As directed    Lifting restrictions  As directed    Comments:     No lifting for 6 weeks   TED hose  As directed    Comments:     Use stockings (TED hose) for 3  weeks on both leg(s).  You may remove them at night for sleeping.      DISCHARGE MEDICATIONS:     Medication List    STOP taking these medications  aspirin EC 81 MG tablet      TAKE these medications       Calcium Citrate-Vitamin D 1000-400 Liqd  Take 1 tablet by mouth 2 (two) times daily.     carvedilol 12.5 MG tablet  Commonly known as:  COREG   Take 12.5 mg by mouth 2 (two) times daily.     celecoxib 200 MG capsule  Commonly known as:  CELEBREX  Take 1 capsule (200 mg total) by mouth every 12 (twelve) hours.     diltiazem 180 MG 24 hr capsule  Commonly known as:  CARDIZEM CD  Take 180 mg by mouth daily.     enoxaparin 40 MG/0.4ML injection  Commonly known as:  LOVENOX  Inject 0.4 mLs (40 mg total) into the skin daily.     lisinopril 10 MG tablet  Commonly known as:  PRINIVIL,ZESTRIL  Take 10 mg by mouth 2 (two) times daily.     methocarbamol 500 MG tablet  Commonly known as:  ROBAXIN  Take 1-2 tablets (500-1,000 mg total) by mouth every 6 (six) hours as needed for muscle spasms.     oxyCODONE 5 MG immediate release tablet  Commonly known as:  Oxy IR/ROXICODONE  Take 1-2 tablets (5-10 mg total) by mouth every 3 (three) hours as needed for breakthrough pain.     warfarin 6 MG tablet  Commonly known as:  COUMADIN  Take 3-6 mg by mouth daily. Takes  tablet on Mondays, Tuesdays, Thursdays, and Friday.  Takes 1 tablet on Saturdays, Sundays, and Wednesdays.        FOLLOW UP VISIT:       Follow-up Information   Follow up with Raymon Mutton, MD. Call on 04/26/2013.   Specialty:  Orthopedic Surgery   Contact information:   [address] [place] [address] [phone]       DISPOSITION: HOME   CONDITION:  Good   Berman Grainger 04/14/2013, 10:13 AM

## 2015-04-20 IMAGING — CR DG CHEST 2V
2 series · 2 of 2 positions shown · non-contrast
Comparison: None.

CLINICAL DATA: preop

EXAM:
CHEST  2 VIEW

[w chest pa]
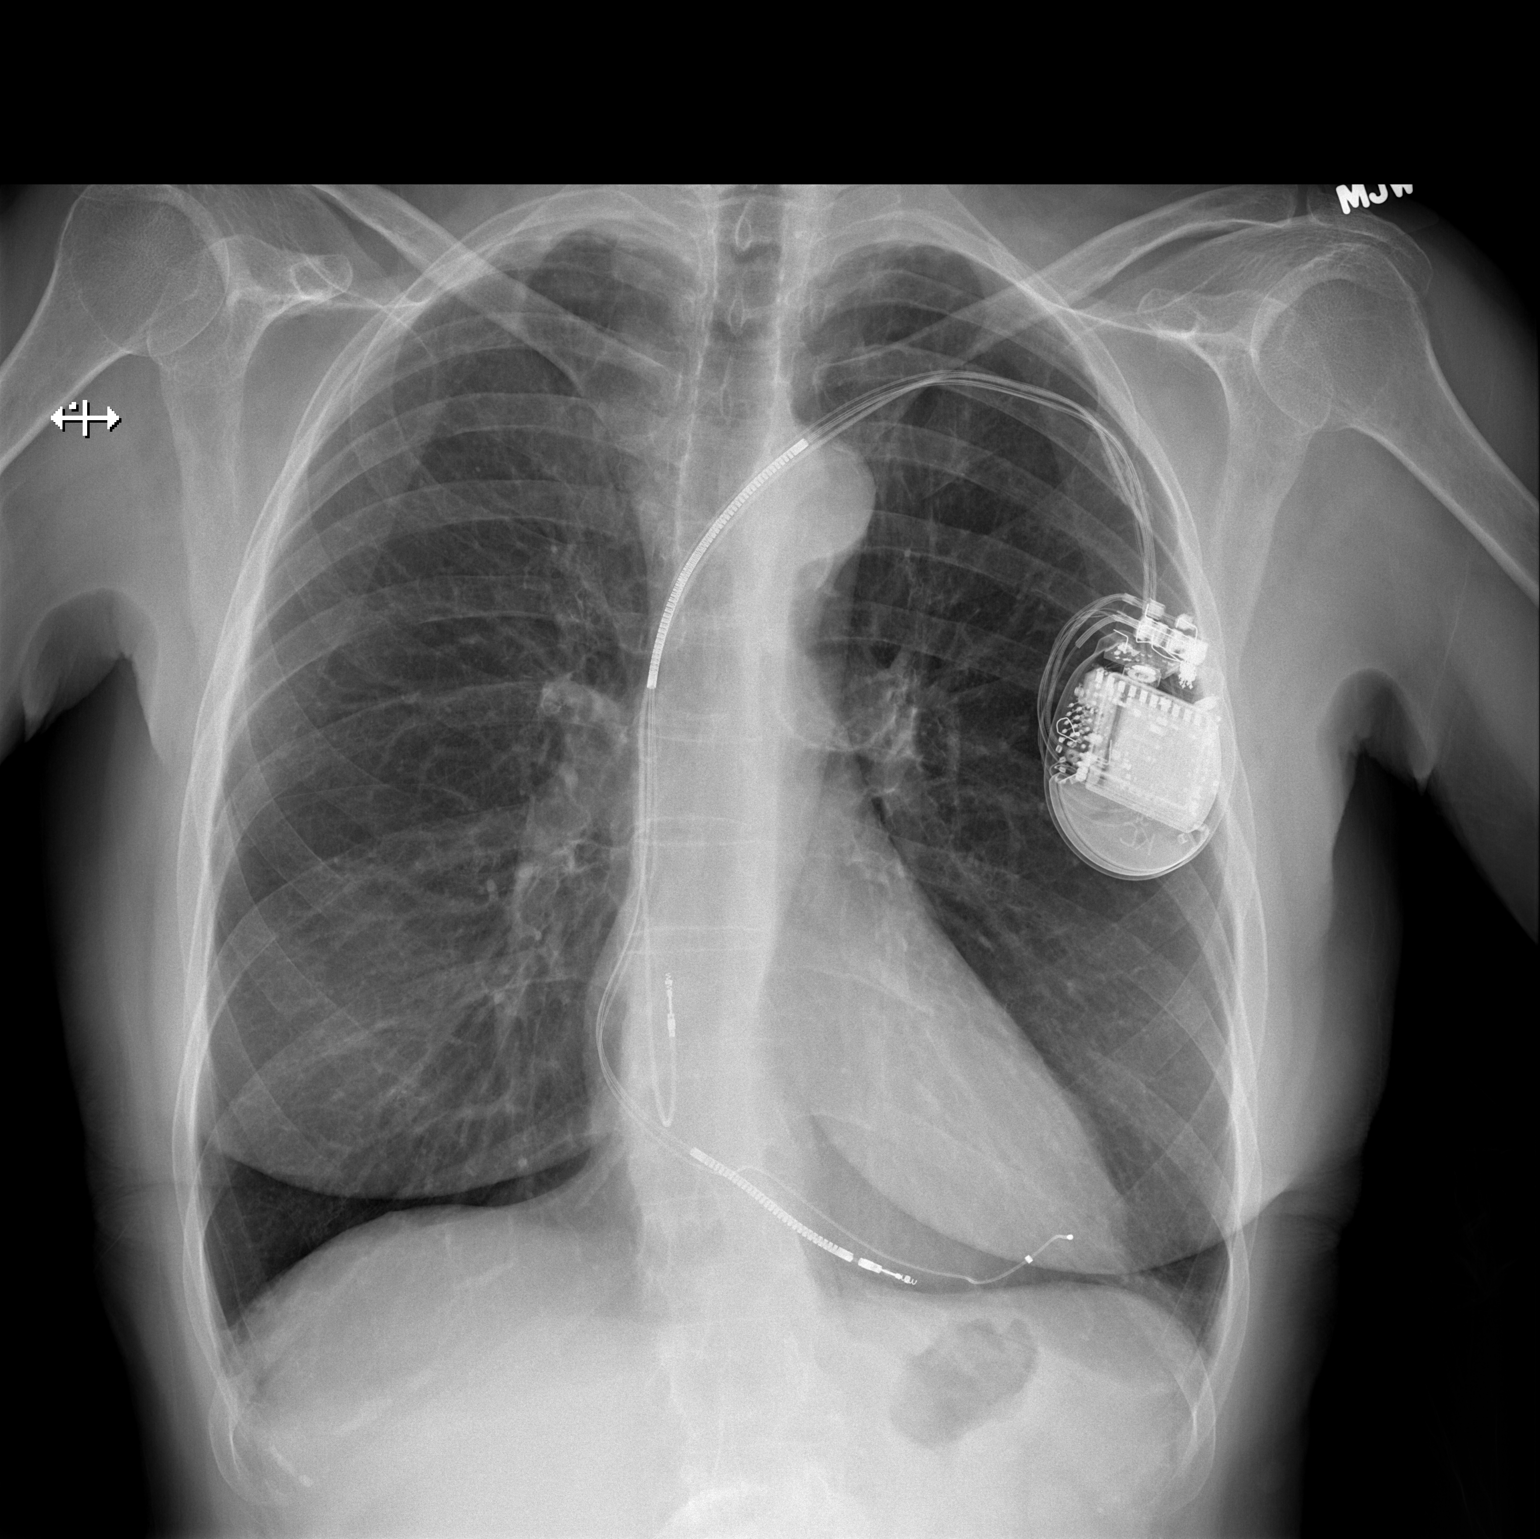

[w chest lat]
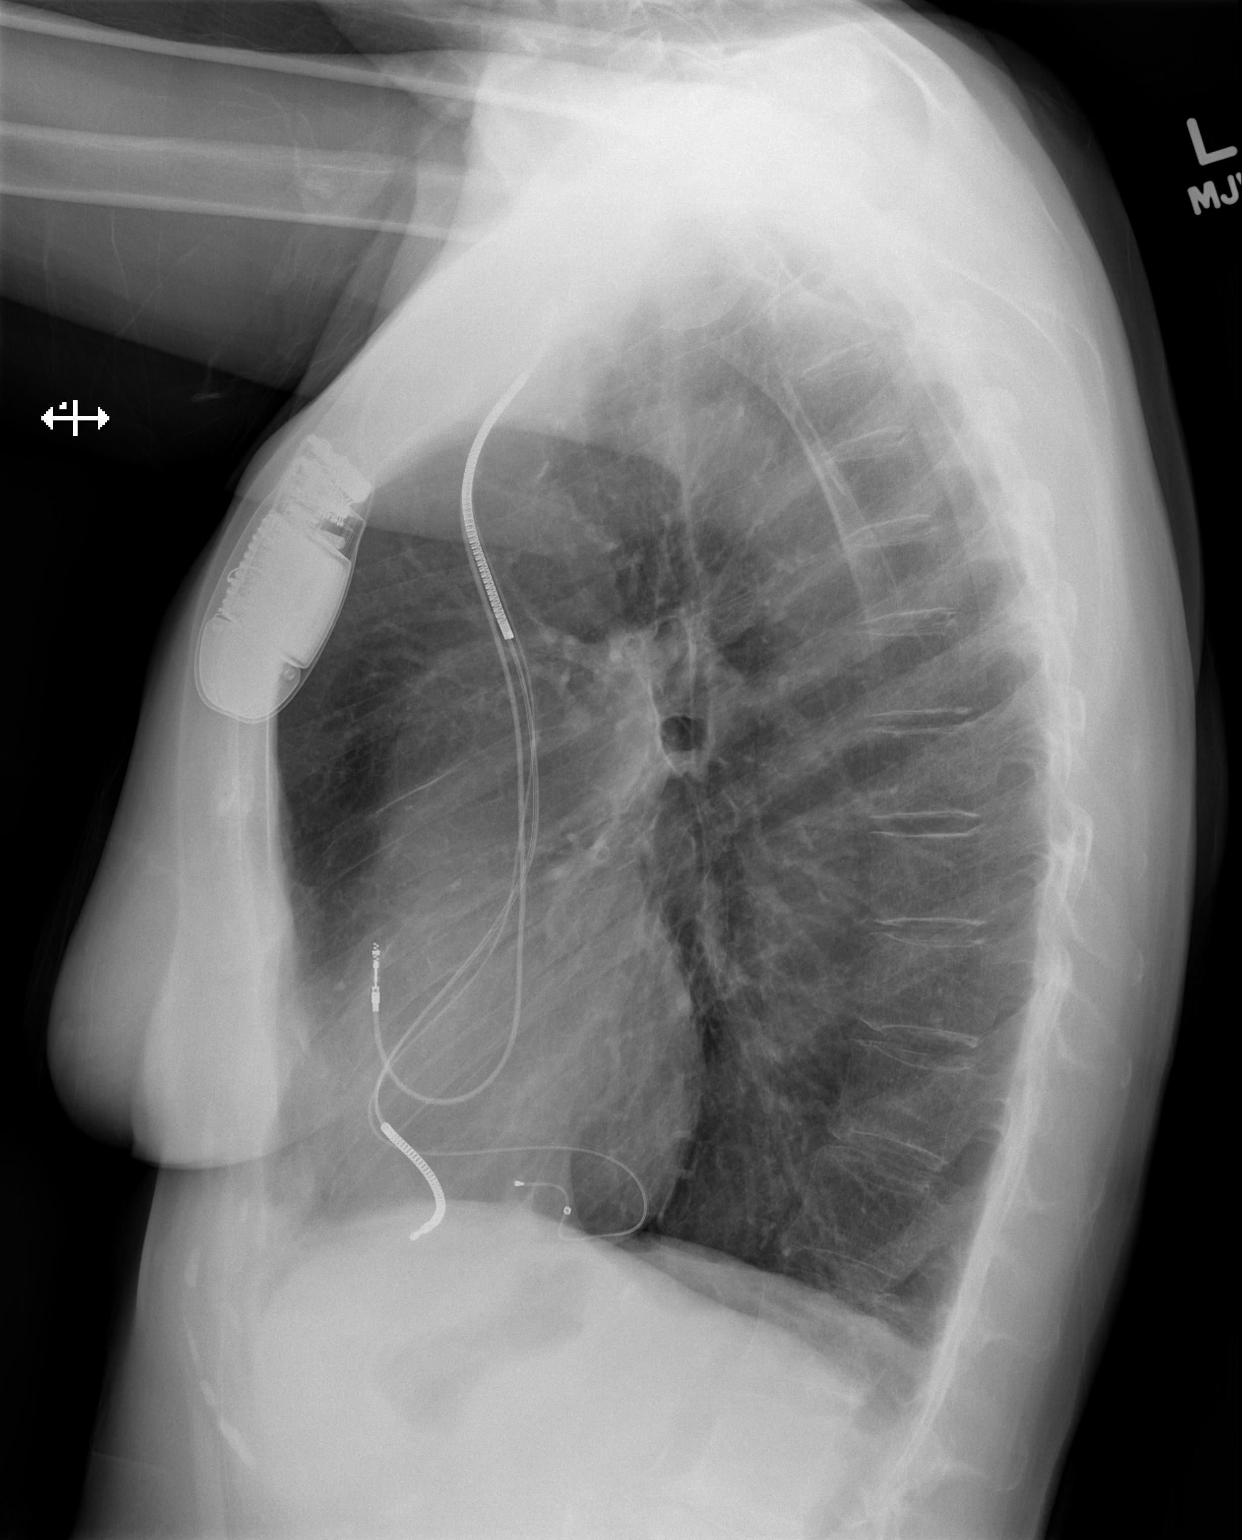

[2 of 2 positions shown; findings below may reference images not displayed]

FINDINGS: Left-sided pacemaker overlies normal cardiac silhouette. No
effusion, infiltrate, pneumothorax. No acute osseous abnormality.
IMPRESSION: No acute cardiopulmonary process.

## 2016-06-24 ENCOUNTER — Other Ambulatory Visit: Payer: Self-pay | Admitting: Orthopedic Surgery

## 2016-07-11 ENCOUNTER — Other Ambulatory Visit: Payer: Self-pay

## 2016-07-11 ENCOUNTER — Encounter (HOSPITAL_COMMUNITY)
Admission: RE | Admit: 2016-07-11 | Discharge: 2016-07-11 | Disposition: A | Discharge status: Home / Self Care | Payer: Medicare Other | Source: Ambulatory Visit | Attending: Orthopedic Surgery | Admitting: Orthopedic Surgery

## 2016-07-11 ENCOUNTER — Encounter (HOSPITAL_COMMUNITY): Payer: Self-pay

## 2016-07-11 DIAGNOSIS — Z01818 Encounter for other preprocedural examination: Secondary | ICD-10-CM | POA: Diagnosis not present

## 2016-07-11 HISTORY — DX: Atrial septal defect: Q21.1

## 2016-07-11 HISTORY — DX: Cerebral infarction, unspecified: I63.9

## 2016-07-11 HISTORY — DX: Traumatic subdural hemorrhage with loss of consciousness of unspecified duration, initial encounter: S06.5X9A

## 2016-07-11 LAB — COMPREHENSIVE METABOLIC PANEL
ALBUMIN: 3.8 g/dL (ref 3.5–5.0)
ALT: 19 U/L (ref 14–54)
AST: 27 U/L (ref 15–41)
Alkaline Phosphatase: 70 U/L (ref 38–126)
Anion gap: 8 (ref 5–15)
BUN: 14 mg/dL (ref 6–20)
CHLORIDE: 106 mmol/L (ref 101–111)
CO2: 28 mmol/L (ref 22–32)
Calcium: 9.5 mg/dL (ref 8.9–10.3)
Creatinine, Ser: 0.67 mg/dL (ref 0.44–1.00)
GFR calc Af Amer: >60 mL/min (ref >60)
GFR calc non Af Amer: >60 mL/min (ref >60)
GLUCOSE: 125 mg/dL — AB (ref 65–99)
POTASSIUM: 3.8 mmol/L (ref 3.5–5.1)
SODIUM: 142 mmol/L (ref 135–145)
Total Bilirubin: 0.8 mg/dL (ref 0.3–1.2)
Total Protein: 6.8 g/dL (ref 6.5–8.1)

## 2016-07-11 LAB — CBC WITH DIFFERENTIAL/PLATELET
BASOS ABS: 0 10*3/uL (ref 0.0–0.1)
BASOS PCT: 1 %
EOS PCT: 1 %
Eosinophils Absolute: 0.1 10*3/uL (ref 0.0–0.7)
HCT: 38.9 % (ref 36.0–46.0)
Hemoglobin: 12.8 g/dL (ref 12.0–15.0)
Lymphocytes Relative: 31 %
Lymphs Abs: 1.7 10*3/uL (ref 0.7–4.0)
MCH: 29.7 pg (ref 26.0–34.0)
MCHC: 32.9 g/dL (ref 30.0–36.0)
MCV: 90.3 fL (ref 78.0–100.0)
MONO ABS: 0.5 10*3/uL (ref 0.1–1.0)
Monocytes Relative: 9 %
Neutro Abs: 3.3 10*3/uL (ref 1.7–7.7)
Neutrophils Relative %: 58 %
PLATELETS: 227 10*3/uL (ref 150–400)
RBC: 4.31 MIL/uL (ref 3.87–5.11)
RDW: 13.8 % (ref 11.5–15.5)
WBC: 5.7 10*3/uL (ref 4.0–10.5)

## 2016-07-11 LAB — SURGICAL PCR SCREEN
MRSA, PCR: NEGATIVE
Staphylococcus aureus: NEGATIVE

## 2016-07-11 NOTE — Pre-Procedure Instructions (Signed)
Dorice Lamasancy T Casados  07/11/2016      [facility] [phone] - [place], [place] - [phone] [place] [address] Phone: [phone] Fax: [phone]    Your procedure is scheduled on Monday February 19.  [facility] at 7:15 A.M.  Call this number if you have problems the morning of surgery:  [phone]   Remember:  Do not eat food or drink liquids after midnight.  Take these medicines the morning of surgery with A SIP OF WATER: acetaminophen (tylenol) if needed, cephalexin (Keflex), sotalol (Betapace)  7 days prior to surgery STOP taking any Aspirin, Aleve, Naproxen, Ibuprofen, Motrin, Advil, Goody's, BC's, all herbal medications, fish oil, and all vitamins  Follow MD's instructions on stopping Eliquis   Do not wear jewelry, make-up or nail polish.  Do not wear lotions, powders, or perfumes, or deoderant.  Do not shave 48 hours prior to surgery.  Men may shave face and neck.  Do not bring valuables to the hospital.  [facility] is not responsible for any belongings or valuables.  Contacts, dentures or bridgework may not be worn into surgery.  Leave your suitcase in the car.  After surgery it may be brought to your room.  For patients admitted to the hospital, discharge time will be determined by your treatment team.  Patients discharged the day of surgery will not be allowed to drive home.    Special instructions:    - Preparing For Surgery  Before surgery, you can play an important role. Because skin is not sterile, your skin needs to be as free of germs as possible. You can reduce the number of germs on your skin by washing with CHG (chlorahexidine gluconate) Soap before surgery.  CHG is an antiseptic cleaner which kills germs and bonds with the skin to continue killing germs even after washing.  Please do not use if you have an allergy to CHG or antibacterial soaps.  If your skin becomes reddened/irritated stop using the CHG.  Do not shave (including legs and underarms) for at least 48 hours prior to first CHG shower. It is OK to shave your face.  Please follow these instructions carefully.   1. Shower the NIGHT BEFORE SURGERY and the MORNING OF SURGERY with CHG.   2. If you chose to wash your hair, wash your hair first as usual with your normal shampoo.  3. After you shampoo, rinse your hair and body thoroughly to remove the shampoo.  4. Use CHG as you would any other liquid soap. You can apply CHG directly to the skin and wash gently with a scrungie or a clean washcloth.   5. Apply the CHG Soap to your body ONLY FROM THE NECK DOWN.  Do not use on open wounds or open sores. Avoid contact with your eyes, ears, mouth and genitals (private parts). Wash genitals (private parts) with your normal soap.  6. Wash thoroughly, paying special attention to the area where your surgery will be performed.  7. Thoroughly rinse your body with warm water from the neck down.  8. DO NOT shower/wash with your normal soap after using and rinsing off the CHG Soap.  9. Pat yourself dry with a CLEAN TOWEL.   10. Wear CLEAN PAJAMAS   11. Place CLEAN SHEETS on your bed the night of your first shower and DO NOT SLEEP WITH PETS.    Day of Surgery:  Do not apply any deodorants/lotions. Please wear clean clothes to the hospital/surgery center.      Please read over the following fact sheets that you were given. Total Joint Packet and MRSA Information

## 2016-07-11 NOTE — Progress Notes (Addendum)
No PCP currently.  Pt sees Dr. Dot LanesPisani and Dr. Wandra Mannanhomas O'Neill for cardiology/CHF Dr. Gary FleetWhalen manages ICD/PPM, form faxed to MD. Kerry FortBrian Small with [employer] notified of patient's procedure date and time.   Pt has had an ECHO (05/30/15)  and cath in the past, unsure when cath was, records requested from MD.   Pt instructed for last dose of Eliquis to be the Thursday prior to surgery. 07/18/15 per Dr. Tobin ChadLucey's office.   Pt with no complaints of chest pain, SOB or signs of infection at PAT appointment.

## 2016-07-14 ENCOUNTER — Encounter (HOSPITAL_COMMUNITY): Payer: Self-pay

## 2016-07-14 NOTE — Progress Notes (Signed)
Anesthesia Chart Review: Patient is a 76 year old female scheduled for right TKA on 07/21/16 by Dr. Sherlean FootLucey. Case is posted for spinal anesthesia.  History includes former smoker, dysrhythmia/afib s/p cardioversion X 1 (last 08/2014), CHF, non-ischemic cardiomyopathy, CRT-D [employer] ICD 02/15/08 (by notes, generator changed 05/2014), hypertension, LLE peripheral edema and neuropathy (venous insufficiency), SDH 09/2013 (while on warfarin; s/p failed watchman placement complicated by LA perforation and hemopericardium 07/31/14; changed to apixaban and sotalol), low iron, urinary incontinence, exertional dyspnea, CVA (sometime between 2014-2016), tonsillectomy, right THA '01, appendectomy, left TKA '14.   Meds include Prolia, Elilqius, Lasix, lisinopril, Toprol-XL, sotalol, tetrahydrozoline ophthalmic. Last dose Eliquis prior to surgery 07/17/16.  PCP is Dr. Lance Sellwana Roakhzay Jaff with [facility] [place] (see Care Everywhere). She saw patient on 06/09/16 and recommended cardiology clearance for surgery. Eliquis to be held for 3 days prior to surgery.   Cardiologist is Dr. Wandra [facility]anhomas [facility], last visit 04/03/16 (Care Everywhere). Dr. Bufford Buttner'Neill recommended she continue cardiology follow-up with Dr. Horald ChestnutBarbara Pisani, but did not feel that she had to continue follow-up at his clinic [facility]). He signed a note of cardiac clearance recommending to continue beta blocker, use fluids judiciously and avoid general anesthesia if feasible. He also gave permission to hold anticoagulation and resume postoperatively.  EP cardiologist is Dr. Bethann BerkshirePatrick Whalen. By 08/22/15 interrogation, she is by ventricularly pacing greater than 99% of the time. One year follow-up with remote device checks in the interim recommended (there is an encounter for 05/30/16 device check in Care Everywhere). From completed indicated that patient was not pacer dependent. Magnet recommended with post-operative  interrogation.   BP (!) 155/75   Pulse 84   Temp 36.8 C   Resp 20   Ht 5\' 8"  (1.727 m)   Wt 156 lb (70.8 kg)   SpO2 96%   BMI 23.72 kg/m    EKG 07/11/16: Atrial paced rhythm, nonspecific intraventricular conduction block.  Echo (TTE) 05/30/15 [facility]; Care Everywhere): SUMMARY There is normal left ventricular wall thickness. LV ejection fraction = 40-45%. LV systolic function is mildly to moderately reduced. The right ventricle is normal in size and function. Device lead in the right ventricle There is aortic valve sclerosis. The aortic valve is trileaflet. There is mild aortic regurgitation. There is mild mitral annular calcification. There is mild mitral regurgitation. The tricuspid valve is normal in structure and function. There is mild tricuspid regurgitation. Estimated right ventricular systolic pressure is 21 mmHg. There is no pericardial effusion. LV function improved compared to the previous study of 08/28/2014 and the  mitral regurgitation is significantly less.  TEE 08/30/14 [facility]; Care Everywhere): SUMMARY The left atrium is moderately dilated. The right atrium is mildly dilated. There is a small ASD in the interatrial septum with continuous left to right  flow by doppler. Structurally normal aortic valve. There is mild centrally located aortic regurgitation. There is mild mitral annular calcification. There is poor coaptation of the  anterior and posterior mitral leaflets resulting in mild to moderate mitral  regurgitation. The ERO assessed by PISA is 0.11. There is mild tricuspid regurgitation. (See by CT surgeon Dr. Delilah ShanAdrian Lata. MR exacerbated in the setting of afib with RVR. Medical therapy recommended with follow-up echo.)  LHC/RHC 08/31/14 [facility]; Care Everywhere): Normal coronaries. Right dominance. PRESSURES:  SiteBaseline Values, mm HgS/P Angiography, mm Hg  S/D Mean  S/DMean RA 6 / 65 RV 31 / 3  PA 33 / 2026 PCW23 / 1819 AO  109 / 8494  OXIMETRY:  Site% O2 Sat SVC 59 RA65 RV64 PA64 AO94 OTHER HEMODYNAMIC INFORMATION:  Heart Rate:133  Hemoglobin:12.1 gm/dL  A-V O2 Difference: 16.1 vol %  O2 Consumption:207.1 ml/min   Cardiac Output (L/min): Fick:4.1 Thermal: 2.83  Cardiac Index (L/min/M sq): Fick:2.27 Thermal: 1.56  Vascular Resistance (dyne sec cm-5): Systemic: 1736.6 Total Pulmonary:507.3 Pulmonary Arteriolar: 136.6   CCTA Coronary 11/04/13 [facility]; Care Everywhere): Result Impression: 1.Normal size left atrium. Left atrial appendage is completely normal with no evidence of filling defect within the left atrial appendage. Normal left atrial venous anatomy. There are 2 pulmonary veins bilaterally. 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 2.Mild coronary artery disease.Patients total coronary artery calcium score is 177, which is 70 percentile for patients of matched age, gender and race/ethnicity.  CXR 06/09/16 [facility]; Care Everywhere): Impression: No evidence of acute cardiopulmonary disease. Hyperinflated lung volumes may reflect underlying COPD.  Preoperative labs noted. She is for PT/INR on the day of surgery.  Reviewed above history with anesthesiologist Dr. Chaney Malling. Cardiology and PCP are  both aware of surgery plans. Cardiac clearance on chart. If no acute changes it is anticipated that she can proceed as planned.  Velna Ochs [facility] Short Stay Center/Anesthesiology Phone [phone] 07/14/2016 2:35 PM

## 2016-07-18 MED ORDER — BUPIVACAINE LIPOSOME 1.3 % IJ SUSP
20.0000 mL | Freq: Once | INTRAMUSCULAR | Status: AC
  Administered 2016-07-21: 20 mL
  Filled 2016-07-18: qty 20

## 2016-07-18 MED ORDER — TRANEXAMIC ACID 1000 MG/10ML IV SOLN
1000.0000 mg | INTRAVENOUS | Status: AC
  Administered 2016-07-21: 1000 mg via INTRAVENOUS
  Filled 2016-07-18: qty 10

## 2016-07-20 NOTE — H&P (Signed)
Sara Underwood MRN:  [id number] DOB/SEX:  [birth year]/female  CHIEF COMPLAINT:  Painful right Knee  HISTORY: Patient is a 76 y.o. female presented with a history of pain in the right knee. Onset of symptoms was gradual starting a few years ago with gradually worsening course since that time. Patient has been treated conservatively with over-the-counter NSAIDs and activity modification. Patient currently rates pain in the knee at 10 out of 10 with activity. There is pain at night.  PAST MEDICAL HISTORY: There are no active problems to display for this patient.  Past Medical History:  Diagnosis Date  . ASD (atrial septal defect)    small ASD in the interatrial septum with left to right flow by Doppler 08/20/14 TEE [facility])  . Automatic implantable cardioverter-defibrillator in situ    St Jude CRT-D 02/15/08 (reported generator change 05/2014), [facility]  . CHF (congestive heart failure) (HCC)   . Constipation   . Dysrhythmia    PAF  . Exertional shortness of breath   . History of bladder infections ~ 01/2013  . History of blood transfusion    "when I had ovary removed" (04/11/2013)  . History of colon polyps   . Hypertension    takes Diltiazem daily and Lisinopril bid  . Joint pain   . Joint swelling   . Low iron   . Neuropathy (HCC)    left  . Osteoarthritis    "some in my back; in my left knee" (04/11/2013)  . Pacemaker   . Peripheral edema    left leg  . SDH (subdural hematoma) (HCC)    09/2013 (while on warfarin)  . Stroke [facility](HCC)    sometime between 2014-2016  . Urinary incontinence    Past Surgical History:  Procedure Laterality Date  . APPENDECTOMY  ~ 1978  . CARDIAC CATHETERIZATION  2009   08/31/14 [facility]): Normal coronaries  . CARDIOVERSION    . COLONOSCOPY  2005  . JOINT REPLACEMENT    . pacemaker/defib  [address]2009   St Jude  . SALPINGOOPHORECTOMY Right ~ 1978  . TONSILLECTOMY    . TOTAL HIP ARTHROPLASTY Right 2001  . TOTAL KNEE ARTHROPLASTY  Left 04/11/2013  . TOTAL KNEE ARTHROPLASTY Left 04/11/2013   Procedure: LEFT TOTAL KNEE ARTHROPLASTY;  Surgeon: Dannielle HuhSteve Lucey, MD;  Location: [facility] OR;  Service: Orthopedics;  Laterality: Left;  . WRIST FRACTURE SURGERY Right 80's   manipulation under anesthesia     MEDICATIONS:   No prescriptions prior to admission.    ALLERGIES:   Allergies  Allergen Reactions  . Iodine Hives and Swelling  . Povidone Iodine Hives  . Shellfish-Derived Products Itching    Can eat shellfish, but cannot touch raw shrimp  . Tape Hives    REVIEW OF SYSTEMS:  A comprehensive review of systems was negative except for: Musculoskeletal: positive for arthralgias and bone pain   FAMILY HISTORY:  No family history on file.  SOCIAL HISTORY:   Social History  Substance Use Topics  . Smoking status: Former Smoker    Years: 25.00  . Smokeless tobacco: Never Used     Comment: 04/11/2013 quit smoking 25 yrs ago;social smoker  . Alcohol use 1.2 oz/week    2 Glasses of wine per week     Comment: socially drinks wine     EXAMINATION:  Vital signs in last 24 hours:    There were no vitals taken for this visit.  General Appearance:    Alert, cooperative, no distress, appears  stated age  Head:    Normocephalic, without obvious abnormality, atraumatic  Eyes:    PERRL, conjunctiva/corneas clear, EOM's intact, fundi    benign, both eyes  Ears:    Normal TM's and external ear canals, both ears  Nose:   Nares normal, septum midline, mucosa normal, no drainage    or sinus tenderness  Throat:   Lips, mucosa, and tongue normal; teeth and gums normal  Neck:   Supple, symmetrical, trachea midline, no adenopathy;    thyroid:  no enlargement/tenderness/nodules; no carotid   bruit or JVD  Back:     Symmetric, no curvature, ROM normal, no CVA tenderness  Lungs:     Clear to auscultation bilaterally, respirations unlabored  Chest Wall:    No tenderness or deformity   Heart:    Regular rate and rhythm, S1 and S2  normal, no murmur, rub   or gallop  Breast Exam:    No tenderness, masses, or nipple abnormality  Abdomen:     Soft, non-tender, bowel sounds active all four quadrants,    no masses, no organomegaly  Genitalia:    Normal female without lesion, discharge or tenderness  Rectal:    Normal tone, no masses or tenderness;   guaiac negative stool  Extremities:   Extremities normal, atraumatic, no cyanosis or edema  Pulses:   2+ and symmetric all extremities  Skin:   Skin color, texture, turgor normal, no rashes or lesions  Lymph nodes:   Cervical, supraclavicular, and axillary nodes normal  Neurologic:   CNII-XII intact, normal strength, sensation and reflexes    throughout    Musculoskeletal:  ROM 0-120, Ligaments intact,  Imaging Review Plain radiographs demonstrate severe degenerative joint disease of the right knee. The overall alignment is neutral. The bone quality appears to be excellent for age and reported activity level.  Assessment/Plan: Primary osteoarthritis, right knee   The patient history, physical examination and imaging studies are consistent with advanced degenerative joint disease of the right knee. The patient has failed conservative treatment.  The clearance notes were reviewed.  After discussion with the patient it was felt that Total Knee Replacement was indicated. The procedure,  risks, and benefits of total knee arthroplasty were presented and reviewed. The risks including but not limited to aseptic loosening, infection, blood clots, vascular injury, stiffness, patella tracking problems complications among others were discussed. The patient acknowledged the explanation, agreed to proceed with the plan.  Guy Sandifer 07/20/2016, 9:25 PM

## 2016-07-21 ENCOUNTER — Encounter (HOSPITAL_COMMUNITY): Payer: Self-pay | Admitting: *Deleted

## 2016-07-21 ENCOUNTER — Inpatient Hospital Stay (HOSPITAL_COMMUNITY): Payer: Medicare Other | Admitting: Vascular Surgery

## 2016-07-21 ENCOUNTER — Inpatient Hospital Stay (HOSPITAL_COMMUNITY): Payer: Medicare Other | Admitting: Certified Registered Nurse Anesthetist

## 2016-07-21 ENCOUNTER — Encounter (HOSPITAL_COMMUNITY)
Admission: RE | Disposition: A | Discharge status: Home / Self Care | Payer: Self-pay | Source: Ambulatory Visit | Attending: Orthopedic Surgery

## 2016-07-21 ENCOUNTER — Observation Stay (HOSPITAL_COMMUNITY)
Admission: RE | Admit: 2016-07-21 | Discharge: 2016-07-22 | Disposition: A | Discharge status: Home / Self Care | Payer: Medicare Other | Source: Ambulatory Visit | Attending: Orthopedic Surgery | Admitting: Orthopedic Surgery

## 2016-07-21 DIAGNOSIS — Z888 Allergy status to other drugs, medicaments and biological substances status: Secondary | ICD-10-CM | POA: Insufficient documentation

## 2016-07-21 DIAGNOSIS — Z91013 Allergy to seafood: Secondary | ICD-10-CM | POA: Insufficient documentation

## 2016-07-21 DIAGNOSIS — M479 Spondylosis, unspecified: Secondary | ICD-10-CM | POA: Insufficient documentation

## 2016-07-21 DIAGNOSIS — Z96659 Presence of unspecified artificial knee joint: Secondary | ICD-10-CM

## 2016-07-21 DIAGNOSIS — I509 Heart failure, unspecified: Secondary | ICD-10-CM | POA: Diagnosis not present

## 2016-07-21 DIAGNOSIS — Z86010 Personal history of colonic polyps: Secondary | ICD-10-CM | POA: Insufficient documentation

## 2016-07-21 DIAGNOSIS — I110 Hypertensive heart disease with heart failure: Secondary | ICD-10-CM | POA: Insufficient documentation

## 2016-07-21 DIAGNOSIS — Q211 Atrial septal defect: Secondary | ICD-10-CM | POA: Insufficient documentation

## 2016-07-21 DIAGNOSIS — Z91041 Radiographic dye allergy status: Secondary | ICD-10-CM | POA: Insufficient documentation

## 2016-07-21 DIAGNOSIS — Z7901 Long term (current) use of anticoagulants: Secondary | ICD-10-CM | POA: Insufficient documentation

## 2016-07-21 DIAGNOSIS — G629 Polyneuropathy, unspecified: Secondary | ICD-10-CM | POA: Insufficient documentation

## 2016-07-21 DIAGNOSIS — Z95810 Presence of automatic (implantable) cardiac defibrillator: Secondary | ICD-10-CM | POA: Diagnosis not present

## 2016-07-21 DIAGNOSIS — Z87891 Personal history of nicotine dependence: Secondary | ICD-10-CM | POA: Diagnosis not present

## 2016-07-21 DIAGNOSIS — I480 Paroxysmal atrial fibrillation: Secondary | ICD-10-CM | POA: Diagnosis not present

## 2016-07-21 DIAGNOSIS — M1711 Unilateral primary osteoarthritis, right knee: Principal | ICD-10-CM | POA: Insufficient documentation

## 2016-07-21 DIAGNOSIS — Z8673 Personal history of transient ischemic attack (TIA), and cerebral infarction without residual deficits: Secondary | ICD-10-CM | POA: Diagnosis not present

## 2016-07-21 HISTORY — PX: TOTAL KNEE ARTHROPLASTY: SHX125

## 2016-07-21 LAB — PROTIME-INR
INR: 1.03
Prothrombin Time: 13.5 seconds (ref 11.4–15.2)

## 2016-07-21 SURGERY — ARTHROPLASTY, KNEE, TOTAL
Anesthesia: Spinal | Site: Knee | Laterality: Right

## 2016-07-21 MED ORDER — SOTALOL HCL 80 MG PO TABS
80.0000 mg | ORAL_TABLET | Freq: Two times a day (BID) | ORAL | Status: DC
  Administered 2016-07-21 – 2016-07-22 (×2): 80 mg via ORAL
  Filled 2016-07-21 (×2): qty 1

## 2016-07-21 MED ORDER — ONDANSETRON HCL 4 MG/2ML IJ SOLN: 4.0000 mg | Freq: Four times a day (QID) | INTRAMUSCULAR | Status: DC | PRN

## 2016-07-21 MED ORDER — ONDANSETRON HCL 4 MG PO TABS: 4.0000 mg | ORAL_TABLET | Freq: Four times a day (QID) | ORAL | Status: DC | PRN

## 2016-07-21 MED ORDER — ACETAMINOPHEN 500 MG PO TABS
1000.0000 mg | ORAL_TABLET | Freq: Once | ORAL | Status: AC
  Administered 2016-07-21: 1000 mg via ORAL
  Filled 2016-07-21: qty 2

## 2016-07-21 MED ORDER — GABAPENTIN 300 MG PO CAPS
300.0000 mg | ORAL_CAPSULE | Freq: Three times a day (TID) | ORAL | Status: DC
  Administered 2016-07-21 – 2016-07-22 (×3): 300 mg via ORAL
  Filled 2016-07-21 (×3): qty 1

## 2016-07-21 MED ORDER — METHOCARBAMOL 500 MG PO TABS: 500.0000 mg | ORAL_TABLET | Freq: Four times a day (QID) | ORAL | Status: DC | PRN

## 2016-07-21 MED ORDER — LISINOPRIL 20 MG PO TABS
30.0000 mg | ORAL_TABLET | Freq: Every day | ORAL | Status: DC
  Administered 2016-07-21 – 2016-07-22 (×2): 30 mg via ORAL
  Filled 2016-07-21 (×3): qty 1

## 2016-07-21 MED ORDER — BISACODYL 5 MG PO TBEC: 5.0000 mg | DELAYED_RELEASE_TABLET | Freq: Every day | ORAL | Status: DC | PRN

## 2016-07-21 MED ORDER — DEXTROSE 5 % IV SOLN
INTRAVENOUS | Status: DC | PRN
  Administered 2016-07-21: 10 ug/min via INTRAVENOUS

## 2016-07-21 MED ORDER — FENTANYL CITRATE (PF) 100 MCG/2ML IJ SOLN
INTRAMUSCULAR | Status: AC
  Administered 2016-07-21: 50 ug via INTRAVENOUS
  Filled 2016-07-21: qty 2

## 2016-07-21 MED ORDER — CEFAZOLIN SODIUM-DEXTROSE 2-4 GM/100ML-% IV SOLN
2.0000 g | INTRAVENOUS | Status: AC
  Administered 2016-07-21: 2 g via INTRAVENOUS

## 2016-07-21 MED ORDER — DEXAMETHASONE SODIUM PHOSPHATE 10 MG/ML IJ SOLN
10.0000 mg | Freq: Once | INTRAMUSCULAR | Status: AC
  Administered 2016-07-22: 10 mg via INTRAVENOUS
  Filled 2016-07-21: qty 1

## 2016-07-21 MED ORDER — GABAPENTIN 300 MG PO CAPS
300.0000 mg | ORAL_CAPSULE | Freq: Once | ORAL | Status: AC
  Administered 2016-07-21: 300 mg via ORAL

## 2016-07-21 MED ORDER — APIXABAN 2.5 MG PO TABS
2.5000 mg | ORAL_TABLET | Freq: Two times a day (BID) | ORAL | Status: DC
  Administered 2016-07-22: 2.5 mg via ORAL
  Filled 2016-07-21: qty 1

## 2016-07-21 MED ORDER — ROPIVACAINE HCL 7.5 MG/ML IJ SOLN
INTRAMUSCULAR | Status: DC | PRN
  Administered 2016-07-21: 20 mL via PERINEURAL

## 2016-07-21 MED ORDER — FUROSEMIDE 20 MG PO TABS
20.0000 mg | ORAL_TABLET | Freq: Every day | ORAL | Status: DC
  Administered 2016-07-21 – 2016-07-22 (×2): 20 mg via ORAL
  Filled 2016-07-21 (×2): qty 1

## 2016-07-21 MED ORDER — PHENOL 1.4 % MT LIQD
1.0000 | OROMUCOSAL | Status: DC | PRN
Start: 2016-07-21 — End: 2016-07-22

## 2016-07-21 MED ORDER — METHOCARBAMOL 1000 MG/10ML IJ SOLN
500.0000 mg | Freq: Four times a day (QID) | INTRAVENOUS | Status: DC | PRN
  Filled 2016-07-21: qty 5

## 2016-07-21 MED ORDER — BUPIVACAINE-EPINEPHRINE (PF) 0.5% -1:200000 IJ SOLN
INTRAMUSCULAR | Status: AC
  Filled 2016-07-21: qty 30

## 2016-07-21 MED ORDER — CEFAZOLIN SODIUM-DEXTROSE 2-4 GM/100ML-% IV SOLN
INTRAVENOUS | Status: AC
  Filled 2016-07-21: qty 100

## 2016-07-21 MED ORDER — ZOLPIDEM TARTRATE 5 MG PO TABS: 5.0000 mg | ORAL_TABLET | Freq: Every evening | ORAL | Status: DC | PRN

## 2016-07-21 MED ORDER — CELECOXIB 200 MG PO CAPS
200.0000 mg | ORAL_CAPSULE | Freq: Two times a day (BID) | ORAL | Status: DC
  Administered 2016-07-21 – 2016-07-22 (×3): 200 mg via ORAL
  Filled 2016-07-21 (×3): qty 1

## 2016-07-21 MED ORDER — SODIUM CHLORIDE 0.9 % IR SOLN
Status: DC | PRN
  Administered 2016-07-21: 3000 mL

## 2016-07-21 MED ORDER — HYDROCODONE-ACETAMINOPHEN 7.5-325 MG PO TABS
1.0000 | ORAL_TABLET | Freq: Four times a day (QID) | ORAL | Status: DC
  Administered 2016-07-21 – 2016-07-22 (×2): 1 via ORAL
  Filled 2016-07-21 (×2): qty 1

## 2016-07-21 MED ORDER — DIPHENHYDRAMINE HCL 12.5 MG/5ML PO ELIX: 12.5000 mg | ORAL_SOLUTION | ORAL | Status: DC | PRN

## 2016-07-21 MED ORDER — ALUM & MAG HYDROXIDE-SIMETH 200-200-20 MG/5ML PO SUSP: 30.0000 mL | ORAL | Status: DC | PRN

## 2016-07-21 MED ORDER — CHLORHEXIDINE GLUCONATE 4 % EX LIQD: 60.0000 mL | Freq: Once | CUTANEOUS | Status: DC

## 2016-07-21 MED ORDER — PROMETHAZINE HCL 25 MG/ML IJ SOLN: 6.2500 mg | INTRAMUSCULAR | Status: DC | PRN

## 2016-07-21 MED ORDER — DOCUSATE SODIUM 100 MG PO CAPS
100.0000 mg | ORAL_CAPSULE | Freq: Two times a day (BID) | ORAL | Status: DC
  Administered 2016-07-21 – 2016-07-22 (×3): 100 mg via ORAL
  Filled 2016-07-21 (×3): qty 1

## 2016-07-21 MED ORDER — ACETAMINOPHEN 325 MG PO TABS
650.0000 mg | ORAL_TABLET | Freq: Four times a day (QID) | ORAL | Status: DC | PRN
Start: 2016-07-21 — End: 2016-07-22

## 2016-07-21 MED ORDER — TRANEXAMIC ACID 1000 MG/10ML IV SOLN
1000.0000 mg | Freq: Once | INTRAVENOUS | Status: AC
  Administered 2016-07-21: 1000 mg via INTRAVENOUS
  Filled 2016-07-21: qty 10

## 2016-07-21 MED ORDER — OXYCODONE HCL 5 MG PO TABS
5.0000 mg | ORAL_TABLET | ORAL | Status: DC | PRN
  Administered 2016-07-22: 10 mg via ORAL
  Filled 2016-07-21: qty 2

## 2016-07-21 MED ORDER — CEFAZOLIN IN D5W 1 GM/50ML IV SOLN
1.0000 g | Freq: Four times a day (QID) | INTRAVENOUS | Status: AC
  Administered 2016-07-21 (×2): 1 g via INTRAVENOUS
  Filled 2016-07-21 (×3): qty 50

## 2016-07-21 MED ORDER — HYDROMORPHONE HCL 2 MG/ML IJ SOLN: 1.0000 mg | INTRAMUSCULAR | Status: DC | PRN

## 2016-07-21 MED ORDER — BUPIVACAINE-EPINEPHRINE 0.5% -1:200000 IJ SOLN
INTRAMUSCULAR | Status: DC | PRN
Start: 2016-07-21 — End: 2016-07-21
  Administered 2016-07-21: 30 mL

## 2016-07-21 MED ORDER — PROPOFOL 500 MG/50ML IV EMUL
INTRAVENOUS | Status: DC | PRN
  Administered 2016-07-21: 60 ug/kg/min via INTRAVENOUS

## 2016-07-21 MED ORDER — SENNOSIDES-DOCUSATE SODIUM 8.6-50 MG PO TABS: 1.0000 | ORAL_TABLET | Freq: Every evening | ORAL | Status: DC | PRN

## 2016-07-21 MED ORDER — SODIUM CHLORIDE 0.9 % IV SOLN: INTRAVENOUS | Status: DC

## 2016-07-21 MED ORDER — BUPIVACAINE IN DEXTROSE 0.75-8.25 % IT SOLN
INTRATHECAL | Status: DC | PRN
  Administered 2016-07-21: 1.8 mL via INTRATHECAL

## 2016-07-21 MED ORDER — MENTHOL 3 MG MT LOZG: 1.0000 | LOZENGE | OROMUCOSAL | Status: DC | PRN

## 2016-07-21 MED ORDER — METOCLOPRAMIDE HCL 5 MG PO TABS: 5.0000 mg | ORAL_TABLET | Freq: Three times a day (TID) | ORAL | Status: DC | PRN

## 2016-07-21 MED ORDER — 0.9 % SODIUM CHLORIDE (POUR BTL) OPTIME
TOPICAL | Status: DC | PRN
  Administered 2016-07-21: 1000 mL

## 2016-07-21 MED ORDER — HYDROMORPHONE HCL 1 MG/ML IJ SOLN: 0.2500 mg | INTRAMUSCULAR | Status: DC | PRN

## 2016-07-21 MED ORDER — METOCLOPRAMIDE HCL 5 MG/ML IJ SOLN: 5.0000 mg | Freq: Three times a day (TID) | INTRAMUSCULAR | Status: DC | PRN

## 2016-07-21 MED ORDER — GABAPENTIN 300 MG PO CAPS
ORAL_CAPSULE | ORAL | Status: AC
  Administered 2016-07-21: 300 mg via ORAL
  Filled 2016-07-21: qty 1

## 2016-07-21 MED ORDER — FLEET ENEMA 7-19 GM/118ML RE ENEM: 1.0000 | ENEMA | Freq: Once | RECTAL | Status: DC | PRN

## 2016-07-21 MED ORDER — METOPROLOL SUCCINATE ER 100 MG PO TB24
100.0000 mg | ORAL_TABLET | Freq: Every evening | ORAL | Status: DC
  Administered 2016-07-21: 100 mg via ORAL
  Filled 2016-07-21 (×2): qty 1

## 2016-07-21 MED ORDER — SODIUM CHLORIDE 0.9 % IJ SOLN
INTRAMUSCULAR | Status: DC | PRN
  Administered 2016-07-21: 20 mL

## 2016-07-21 MED ORDER — DEXAMETHASONE SODIUM PHOSPHATE 10 MG/ML IJ SOLN
8.0000 mg | Freq: Once | INTRAMUSCULAR | Status: AC
  Administered 2016-07-21: 8 mg via INTRAVENOUS

## 2016-07-21 MED ORDER — ACETAMINOPHEN 650 MG RE SUPP: 650.0000 mg | Freq: Four times a day (QID) | RECTAL | Status: DC | PRN

## 2016-07-21 MED ORDER — LACTATED RINGERS IV SOLN
INTRAVENOUS | Status: DC
  Administered 2016-07-21 (×3): via INTRAVENOUS

## 2016-07-21 MED ORDER — FENTANYL CITRATE (PF) 100 MCG/2ML IJ SOLN
50.0000 ug | INTRAMUSCULAR | Status: DC | PRN
  Administered 2016-07-21: 50 ug via INTRAVENOUS

## 2016-07-21 MED ORDER — MIDAZOLAM HCL 2 MG/2ML IJ SOLN
INTRAMUSCULAR | Status: AC
  Filled 2016-07-21: qty 2

## 2016-07-21 SURGICAL SUPPLY — 68 items
BANDAGE ACE 6X5 VEL STRL LF (GAUZE/BANDAGES/DRESSINGS) ×3 IMPLANT
BANDAGE ESMARK 6X9 LF (GAUZE/BANDAGES/DRESSINGS) ×1 IMPLANT
BLADE SAGITTAL 13X1.27X60 (BLADE) ×2 IMPLANT
BLADE SAGITTAL 13X1.27X60MM (BLADE) ×1
BLADE SAW SGTL 83.5X18.5 (BLADE) ×3 IMPLANT
BLADE SURG 10 STRL SS (BLADE) ×3 IMPLANT
BNDG CMPR 9X6 STRL LF SNTH (GAUZE/BANDAGES/DRESSINGS) ×1
BNDG ESMARK 6X9 LF (GAUZE/BANDAGES/DRESSINGS) ×3
BOWL SMART MIX CTS (DISPOSABLE) ×3 IMPLANT
CAPT KNEE TOTAL 3 ×3 IMPLANT
CEMENT BONE SIMPLEX SPEEDSET (Cement) ×6 IMPLANT
CHLORAPREP W/TINT 26ML (MISCELLANEOUS) ×2 IMPLANT
CLOSURE WOUND 1/2 X4 (GAUZE/BANDAGES/DRESSINGS) ×1
COVER SURGICAL LIGHT HANDLE (MISCELLANEOUS) ×3 IMPLANT
CUFF TOURNIQUET SINGLE 34IN LL (TOURNIQUET CUFF) ×3 IMPLANT
DRAPE EXTREMITY T 121X128X90 (DRAPE) ×3 IMPLANT
DRAPE HALF SHEET 40X57 (DRAPES) ×3 IMPLANT
DRAPE INCISE 23X17 IOBAN STRL (DRAPES) ×6
DRAPE INCISE 23X17 STRL (DRAPES) IMPLANT
DRAPE INCISE IOBAN 23X17 STRL (DRAPES) ×3 IMPLANT
DRAPE INCISE IOBAN 66X45 STRL (DRAPES) ×2 IMPLANT
DRAPE U-SHAPE 47X51 STRL (DRAPES) ×3 IMPLANT
DRSG AQUACEL AG ADV 3.5X10 (GAUZE/BANDAGES/DRESSINGS) ×3 IMPLANT
DURAPREP 26ML APPLICATOR (WOUND CARE) ×2 IMPLANT
ELECT REM PT RETURN 9FT ADLT (ELECTROSURGICAL) ×3
ELECTRODE REM PT RTRN 9FT ADLT (ELECTROSURGICAL) ×1 IMPLANT
FILTER STRAW FLUID ASPIR (MISCELLANEOUS) IMPLANT
GLOVE BIOGEL M 7.0 STRL (GLOVE) IMPLANT
GLOVE BIOGEL PI IND STRL 7.5 (GLOVE) IMPLANT
GLOVE BIOGEL PI IND STRL 8.5 (GLOVE) ×1 IMPLANT
GLOVE BIOGEL PI INDICATOR 7.5 (GLOVE)
GLOVE BIOGEL PI INDICATOR 8.5 (GLOVE) ×2
GLOVE SURG ORTHO 8.0 STRL STRW (GLOVE) ×6 IMPLANT
GOWN STRL REUS W/ TWL LRG LVL3 (GOWN DISPOSABLE) ×1 IMPLANT
GOWN STRL REUS W/ TWL XL LVL3 (GOWN DISPOSABLE) ×2 IMPLANT
GOWN STRL REUS W/TWL 2XL LVL3 (GOWN DISPOSABLE) ×3 IMPLANT
GOWN STRL REUS W/TWL LRG LVL3 (GOWN DISPOSABLE) ×3
GOWN STRL REUS W/TWL XL LVL3 (GOWN DISPOSABLE) ×6
HANDPIECE INTERPULSE COAX TIP (DISPOSABLE) ×3
HOOD PEEL AWAY FACE SHEILD DIS (HOOD) ×9 IMPLANT
KIT BASIN OR (CUSTOM PROCEDURE TRAY) ×3 IMPLANT
KIT ROOM TURNOVER OR (KITS) ×3 IMPLANT
KNEE CAPITATED TOTAL 3 IMPLANT
MANIFOLD NEPTUNE II (INSTRUMENTS) ×3 IMPLANT
NDL 18GX1X1/2 (RX/OR ONLY) (NEEDLE) IMPLANT
NEEDLE 18GX1X1/2 (RX/OR ONLY) (NEEDLE) IMPLANT
NEEDLE 22X1 1/2 (OR ONLY) (NEEDLE) ×4 IMPLANT
NS IRRIG 1000ML POUR BTL (IV SOLUTION) ×3 IMPLANT
PACK TOTAL JOINT (CUSTOM PROCEDURE TRAY) ×3 IMPLANT
PAD ARMBOARD 7.5X6 YLW CONV (MISCELLANEOUS) ×6 IMPLANT
SET HNDPC FAN SPRY TIP SCT (DISPOSABLE) ×1 IMPLANT
STRIP CLOSURE SKIN 1/2X4 (GAUZE/BANDAGES/DRESSINGS) ×2 IMPLANT
SUCTION FRAZIER HANDLE 10FR (MISCELLANEOUS) ×2
SUCTION TUBE FRAZIER 10FR DISP (MISCELLANEOUS) IMPLANT
SUT MNCRL AB 3-0 PS2 18 (SUTURE) ×3 IMPLANT
SUT VIC AB 0 CTB1 27 (SUTURE) ×6 IMPLANT
SUT VIC AB 1 CT1 27 (SUTURE) ×9
SUT VIC AB 1 CT1 27XBRD ANBCTR (SUTURE) ×2 IMPLANT
SUT VIC AB 2-0 CT1 27 (SUTURE) ×6
SUT VIC AB 2-0 CT1 TAPERPNT 27 (SUTURE) ×2 IMPLANT
SYR 20CC LL (SYRINGE) ×6 IMPLANT
SYR TB 1ML LUER SLIP (SYRINGE) IMPLANT
SYRINGE 20CC LL (MISCELLANEOUS) ×2 IMPLANT
TOWEL OR 17X24 6PK STRL BLUE (TOWEL DISPOSABLE) ×3 IMPLANT
TOWEL OR 17X26 10 PK STRL BLUE (TOWEL DISPOSABLE) ×3 IMPLANT
TRAY CATH 16FR W/PLASTIC CATH (SET/KITS/TRAYS/PACK) IMPLANT
TRAY FOLEY CATH SILVER 14FR (SET/KITS/TRAYS/PACK) IMPLANT
WRAP KNEE MAXI GEL POST OP (GAUZE/BANDAGES/DRESSINGS) ×3 IMPLANT

## 2016-07-21 NOTE — Anesthesia Procedure Notes (Signed)
Anesthesia Regional Block: Adductor canal block   Pre-Anesthetic Checklist: ,, timeout performed, Correct Patient, Correct Site, Correct Laterality, Correct Procedure, Correct Position, site marked, Risks and benefits discussed,  Surgical consent,  Pre-op evaluation,  At surgeon's request and post-op pain management  Laterality: Right  Prep: chloraprep       Needles:  Injection technique: Single-shot  Needle Type: Echogenic Needle     Needle Length: 9cm  Needle Gauge: 21     Additional Needles:   Procedures: ultrasound guided,,,,,,,,  Narrative:  Start time: 07/21/2016 9:07 AM End time: 07/21/2016 9:14 AM Injection made incrementally with aspirations every 5 mL.  Performed by: Personally  Anesthesiologist: Marcene DuosFITZGERALD, Jude Naclerio

## 2016-07-21 NOTE — Anesthesia Preprocedure Evaluation (Signed)
Anesthesia Evaluation  Patient identified by MRN, date of birth, ID band Patient awake    Reviewed: Allergy & Precautions, NPO status , Patient's Chart, lab work & pertinent test results  Airway Mallampati: II  TM Distance: >3 FB Neck ROM: Full    Dental   Pulmonary former smoker,    breath sounds clear to auscultation       Cardiovascular hypertension, Pt. on medications +CHF  + dysrhythmias + pacemaker + Cardiac Defibrillator  Rhythm:Regular Rate:Normal     Neuro/Psych CVA    GI/Hepatic negative GI ROS, Neg liver ROS,   Endo/Other  negative endocrine ROS  Renal/GU negative Renal ROS     Musculoskeletal  (+) Arthritis ,   Abdominal   Peds  Hematology negative hematology ROS (+)   Anesthesia Other Findings   Reproductive/Obstetrics                             Lab Results  Component Value Date   WBC 5.7 07/11/2016   HGB 12.8 07/11/2016   HCT 38.9 07/11/2016   MCV 90.3 07/11/2016   PLT 227 07/11/2016   Lab Results  Component Value Date   CREATININE 0.67 07/11/2016   BUN 14 07/11/2016   NA 142 07/11/2016   K 3.8 07/11/2016   CL 106 07/11/2016   CO2 28 07/11/2016   Lab Results  Component Value Date   INR 1.03 07/21/2016   INR 1.64 (H) 04/13/2013   INR 1.16 04/12/2013    Anesthesia Physical Anesthesia Plan  ASA: III  Anesthesia Plan: Spinal   Post-op Pain Management:  Regional for Post-op pain   Induction: Intravenous  Airway Management Planned: Natural Airway and Simple Face Mask  Additional Equipment:   Intra-op Plan:   Post-operative Plan:   Informed Consent: I have reviewed the patients History and Physical, chart, labs and discussed the procedure including the risks, benefits and alternatives for the proposed anesthesia with the patient or authorized representative who has indicated his/her understanding and acceptance.     Plan Discussed with:  CRNA  Anesthesia Plan Comments:         Anesthesia Quick Evaluation

## 2016-07-21 NOTE — Anesthesia Postprocedure Evaluation (Signed)
Anesthesia Post Note  Patient: Sara Underwood  Procedure(s) Performed: Procedure(s) (LRB): RIGHT TOTAL KNEE ARTHROPLASTY (Right)  Patient location during evaluation: PACU Anesthesia Type: Spinal Level of consciousness: awake and alert, oriented and patient cooperative Pain management: pain level controlled Vital Signs Assessment: post-procedure vital signs reviewed and stable Respiratory status: spontaneous breathing, nonlabored ventilation, respiratory function stable and patient connected to nasal cannula oxygen Cardiovascular status: blood pressure returned to baseline and stable Postop Assessment: spinal receding and patient able to bend at knees Anesthetic complications: no       Last Vitals:  Vitals:   07/21/16 1445 07/21/16 1500  BP:  (!) 155/93  Pulse: 83 68  Resp: 20 20  Temp: 36.7 C 36.5 C    Last Pain:  Vitals:   07/21/16 1500  TempSrc: Oral                 Shajuan Musso,E. Antron Seth

## 2016-07-21 NOTE — Evaluation (Signed)
Physical Therapy Evaluation Patient Details Name: Sara Underwood MRN: [id number] DOB: March 21, 1941 Today's Date: 07/21/2016   History of Present Illness  76 y.o. female now s/p Rt TKA. PMH: Lt TKA, stroke, SDH, pacemaker, defibrillator, neuropathy, HTN, incontinence, CHF, Rt THA.  Clinical Impression  Pt is s/p TKA resulting in the deficits listed below (see PT Problem List). Pt doing well with initial ambulation, noted mild instability during gait and transfers. Anticipate pt will D/C to home with husband assisting following acute stay. Pt will benefit from skilled PT to increase their independence and safety with mobility.      Follow Up Recommendations Home health PT;Supervision for mobility/OOB    Equipment Recommendations  None recommended by PT    Recommendations for Other Services       Precautions / Restrictions Precautions Precautions: Knee;Fall Precaution Booklet Issued: Yes (comment) Precaution Comments: HEP, reviewed knee extension precautions Restrictions Weight Bearing Restrictions: Yes RLE Weight Bearing: Weight bearing as tolerated      Mobility  Bed Mobility Overal bed mobility: Needs Assistance Bed Mobility: Supine to Sit     Supine to sit: HOB elevated;Min assist     General bed mobility comments: using rail to assist  Transfers Overall transfer level: Needs assistance Equipment used: Rolling walker (2 wheeled) Transfers: Sit to/from Stand Sit to Stand: Min assist Stand pivot transfers: Min guard       General transfer comment: cues for hand placement, performed from EOB, chair X3, BSC.   Ambulation/Gait Ambulation/Gait assistance: Min guard Ambulation Distance (Feet): 6 Feet Assistive device: Rolling walker (2 wheeled) Gait Pattern/deviations: Step-to pattern;Decreased stance time - right;Decreased weight shift to right Gait velocity: decreased   General Gait Details: encouraging weightbearing through Rt LE  Stairs             Wheelchair Mobility    Modified Rankin (Stroke Patients Only)       Balance Overall balance assessment: Needs assistance Sitting-balance support: No upper extremity supported Sitting balance-Leahy Scale: Good     Standing balance support: Bilateral upper extremity supported Standing balance-Leahy Scale: Poor Standing balance comment: using rw                             Pertinent Vitals/Pain Pain Assessment: 0-10 Pain Score: 3  Pain Location: Rt knee Pain Descriptors / Indicators: Aching Pain Intervention(s): Limited activity within patient's tolerance;Monitored during session    Home Living Family/patient expects to be discharged to:: Private residence Living Arrangements: Spouse/significant other Available Help at Discharge: Family;Available 24 hours/day Type of Home: House Home Access: Level entry     Home Layout: One level Home Equipment: Walker - 2 wheels;Walker - 4 wheels      Prior Function Level of Independence: Independent with assistive device(s)         Comments: using rollator for ambulation     Hand Dominance        Extremity/Trunk Assessment   Upper Extremity Assessment Upper Extremity Assessment: Overall WFL for tasks assessed    Lower Extremity Assessment Lower Extremity Assessment: RLE deficits/detail RLE Deficits / Details: able to perform SLR with lag       Communication   Communication: No difficulties  Cognition Arousal/Alertness: Awake/alert Behavior During Therapy: WFL for tasks assessed/performed Overall Cognitive Status: Within Functional Limits for tasks assessed                      General Comments  Exercises     Assessment/Plan    PT Assessment Patient needs continued PT services  PT Problem List Decreased strength;Decreased range of motion;Decreased activity tolerance;Decreased balance;Decreased mobility       PT Treatment Interventions      PT Goals (Current goals can be  found in the Care Plan section)  Acute Rehab PT Goals Patient Stated Goal: no pain with activity PT Goal Formulation: With patient Time For Goal Achievement: 08/04/16 Potential to Achieve Goals: Good    Frequency 7X/week   Barriers to discharge        Co-evaluation               End of Session Equipment Utilized During Treatment: Gait belt Activity Tolerance: Patient tolerated treatment well Patient left: in chair;with call bell/phone within reach;with family/visitor present;with nursing/sitter in room (in bone foam) Nurse Communication: Mobility status;Weight bearing status PT Visit Diagnosis: Muscle weakness (generalized) (M62.81);Difficulty in walking, not elsewhere classified (R26.2)    Functional Assessment Tool Used: AM-PAC 6 Clicks Basic Mobility;Clinical judgement Functional Limitation: Mobility: Walking and moving around Mobility: Walking and Moving Around Current Status ([id number](G8978): At least 40 percent but less than 60 percent impaired, limited or restricted Mobility: Walking and Moving Around Goal Status [phone](G8979): At least 1 percent but less than 20 percent impaired, limited or restricted    Time: [id number] PT Time Calculation (min) (ACUTE ONLY): 47 min   Charges:   PT Evaluation $PT Eval Moderate Complexity: 1 Procedure PT Treatments $Gait Training: 8-22 mins $Therapeutic Activity: 8-22 mins   PT G Codes:   PT G-Codes **NOT FOR INPATIENT CLASS** Functional Assessment Tool Used: AM-PAC 6 Clicks Basic Mobility;Clinical judgement Functional Limitation: Mobility: Walking and moving around Mobility: Walking and Moving Around Current Status ([id number](G8978): At least 40 percent but less than 60 percent impaired, limited or restricted Mobility: Walking and Moving Around Goal Status [phone](G8979): At least 1 percent but less than 20 percent impaired, limited or restricted     Christiane HaBenjamin J. Dareen Gutzwiller, PT, CSCS Pager [phone] Office [phone]  07/21/2016, 4:30 PM

## 2016-07-21 NOTE — Anesthesia Procedure Notes (Signed)
Procedure Name: MAC Date/Time: 07/21/2016 10:00 AM Performed by: Oletta Lamas Pre-anesthesia Checklist: Patient identified, Emergency Drugs available, Suction available, Patient being monitored and Timeout performed Patient Re-evaluated:Patient Re-evaluated prior to inductionOxygen Delivery Method: Simple face mask

## 2016-07-21 NOTE — Progress Notes (Signed)
Orthopedic Tech Progress Note Patient Details:  Dorice Lamasancy T Kington 1940/12/02 [id number]  CPM Right Knee CPM Right Knee: On Right Knee Flexion (Degrees): 90 Right Knee Extension (Degrees): 0 Additional Comments: Foot roll   Saul FordyceJennifer C Irelynd Zumstein 07/21/2016, 12:31 PM

## 2016-07-21 NOTE — Anesthesia Procedure Notes (Addendum)
Spinal  Patient location during procedure: OR Start time: 07/21/2016 9:57 AM End time: 07/21/2016 10:02 AM Staffing Anesthesiologist: Marcene DuosFITZGERALD, Nephi Savage Performed: anesthesiologist  Preanesthetic Checklist Completed: patient identified, site marked, surgical consent, pre-op evaluation, timeout performed, IV checked, risks and benefits discussed and monitors and equipment checked Spinal Block Patient position: sitting Prep: ChloraPrep and site prepped and draped Patient monitoring: heart rate, blood pressure and continuous pulse ox Approach: midline Location: L4-5 Injection technique: single-shot Needle Needle type: Pencan  Needle gauge: 24 G Needle length: 9 cm

## 2016-07-21 NOTE — Transfer of Care (Signed)
Immediate Anesthesia Transfer of Care Note  Patient: Sara Underwood  Procedure(s) Performed: Procedure(s): RIGHT TOTAL KNEE ARTHROPLASTY (Right)  Patient Location: PACU  Anesthesia Type:MAC combined with regional for post-op pain  Level of Consciousness: awake, alert , oriented and patient cooperative  Airway & Oxygen Therapy: Patient Spontanous Breathing  Post-op Assessment: Report given to RN and Post -op Vital signs reviewed and stable  Post vital signs: Reviewed and stable  Last Vitals:  Vitals:   07/21/16 0910 07/21/16 0920  BP:  (!) 156/97  Pulse: 98 80  Resp: 16 11  Temp:      Last Pain:  Vitals:   07/21/16 0709  TempSrc: Oral      Patients Stated Pain Goal: 6 (82/95/62 1308)  Complications: No apparent anesthesia complications   Room air saturation 94%.  Denies any pain or discomforts.  Tolerated procedure well.

## 2016-07-21 NOTE — Op Note (Signed)
TOTAL KNEE REPLACEMENT OPERATIVE NOTE:  07/21/2016  12:38 PM  PATIENT:  Sara Underwood  76 y.o. female  PRE-OPERATIVE DIAGNOSIS:  primary osteoarthritis right knee   POST-OPERATIVE DIAGNOSIS:  primary osteoarthritis right knee   PROCEDURE:  Procedure(s): RIGHT TOTAL KNEE ARTHROPLASTY  SURGEON:  Surgeon(s): Dannielle Huh, MD  PHYSICIAN ASSISTANT: Laurier Soua, [facility]   ANESTHESIA:   spinal  DRAINS: Hemovac  SPECIMEN: None  COUNTS:  Correct  TOURNIQUET:   Total Tourniquet Time Documented: Thigh (Right) - 39 minutes Total: Thigh (Right) - 39 minutes   DICTATION:  Indication for procedure:    The patient is a 76 y.o. female who has failed conservative treatment for primary osteoarthritis right knee .  Informed consent was obtained prior to anesthesia. The risks versus benefits of the operation were explain and in a way the patient can, and did, understand.   On the implant demand matching protocol, this patient scored 10.  Therefore, this patient was not receive a polyethylene insert with vitamin E which is a high demand implant.  Description of procedure:     The patient was taken to the operating room and placed under anesthesia.  The patient was positioned in the usual fashion taking care that all body parts were adequately padded and/or protected.  I foley catheter was not placed.  A tourniquet was applied and the leg prepped and draped in the usual sterile fashion.  The extremity was exsanguinated with the esmarch and tourniquet inflated to 350 mmHg.  Pre-operative range of motion was normal.  The knee was in 5 degree of mild valgus.  A midline incision approximately 6-7 inches long was made with a #10 blade.  A new blade was used to make a parapatellar arthrotomy going 2-3 cm into the quadriceps tendon, over the patella, and alongside the medial aspect of the patellar tendon.  A synovectomy was then performed with the #10 blade and forceps. I then elevated the deep MCL off  the medial tibial metaphysis subperiosteally around to the semimembranosus attachment.    I everted the patella and used calipers to measure patellar thickness.  I used the reamer to ream down to appropriate thickness to recreate the native thickness.  I then removed excess bone with the rongeur and sagittal saw.  I used the appropriately sized template and drilled the three lug holes.  I then put the trial in place and measured the thickness with the calipers to ensure recreation of the native thickness.  The trial was then removed and the patella subluxed and the knee brought into flexion.  A homan retractor was place to retract and protect the patella and lateral structures.  A Z-retractor was place medially to protect the medial structures.  The extra-medullary alignment system was used to make cut the tibial articular surface perpendicular to the anamotic axis of the tibia and in 3 degrees of posterior slope.  The cut surface and alignment jig was removed.  I then used the intramedullary alignment guide to make a 4 valgus cut on the distal femur.  I then marked out the epicondylar axis on the distal femur.  The posterior condylar axis measured 5 degrees.  I then used the anterior referencing sizer and measured the femur to be a size 7.  The 4-In-1 cutting block was screwed into place in external rotation matching the posterior condylar angle, making our cuts perpendicular to the epicondylar axis.  Anterior, posterior and chamfer cuts were made with the sagittal saw.  The cutting  block and cut pieces were removed.  A lamina spreader was placed in 90 degrees of flexion.  The ACL, PCL, menisci, and posterior condylar osteophytes were removed.  A 11 mm spacer blocked was found to offer good flexion and extension gap balance after mild in degree releasing.   The scoop retractor was then placed and the femoral finishing block was pinned in place.  The small sagittal saw was used as well as the lug drill to  finish the femur.  The block and cut surfaces were removed and the medullary canal hole filled with autograft bone from the cut pieces.  The tibia was delivered forward in deep flexion and external rotation.  A size D tray was selected and pinned into place centered on the medial 1/3 of the tibial tubercle.  The reamer and keel was used to prepare the tibia through the tray.    I then trialed with the size 7 femur, size D tibia, a 11 mm insert and the 32 patella.  I had excellent flexion/extension gap balance, excellent patella tracking.  Flexion was full and beyond 120 degrees; extension was zero.  These components were chosen and the staff opened them to me on the back table while the knee was lavaged copiously and the cement mixed.  The soft tissue was infiltrated with 60cc of exparel 1.3% through a 21 gauge needle.  I cemented in the components and removed all excess cement.  The polyethylene tibial component was snapped into place and the knee placed in extension while cement was hardening.  The capsule was infilltrated with 30cc of .25% Marcaine with epinephrine.  A hemovac was place in the joint exiting superolaterally.  A pain pump was place superomedially superficial to the arthrotomy.  Once the cement was hard, the tourniquet was let down.  Hemostasis was obtained.  The arthrotomy was closed with figure-8 #1 vicryl sutures.  The deep soft tissues were closed with #0 vicryls and the subcuticular layer closed with a running #2-0 vicryl.  The skin was reapproximated and closed with skin staples.  The wound was dressed with xeroform, 4 x4's, 2 ABD sponges, a single layer of webril and a TED stocking.   The patient was then awakened, extubated, and taken to the recovery room in stable condition.  BLOOD LOSS:  300cc DRAINS: 1 hemovac, 1 pain catheter COMPLICATIONS:  None.  PLAN OF CARE: Admit to inpatient   PATIENT DISPOSITION:  PACU - hemodynamically stable.   Delay start of Pharmacological  VTE agent (>24hrs) due to surgical blood loss or risk of bleeding:  not applicable  Please fax a copy of this op note to my office at [phone] (please only include page 1 and 2 of the Case Information op note)

## 2016-07-22 ENCOUNTER — Encounter (HOSPITAL_COMMUNITY): Payer: Self-pay | Admitting: Orthopedic Surgery

## 2016-07-22 DIAGNOSIS — M1711 Unilateral primary osteoarthritis, right knee: Secondary | ICD-10-CM | POA: Diagnosis not present

## 2016-07-22 LAB — BASIC METABOLIC PANEL
Anion gap: 9 (ref 5–15)
BUN: 12 mg/dL (ref 6–20)
CO2: 24 mmol/L (ref 22–32)
Calcium: 8.2 mg/dL — ABNORMAL LOW (ref 8.9–10.3)
Chloride: 106 mmol/L (ref 101–111)
Creatinine, Ser: 0.54 mg/dL (ref 0.44–1.00)
GFR calc Af Amer: >60 mL/min (ref >60)
GFR calc non Af Amer: >60 mL/min (ref >60)
GLUCOSE: 105 mg/dL — AB (ref 65–99)
POTASSIUM: 3.4 mmol/L — AB (ref 3.5–5.1)
Sodium: 139 mmol/L (ref 135–145)

## 2016-07-22 LAB — CBC
HCT: 31.4 % — ABNORMAL LOW (ref 36.0–46.0)
HEMOGLOBIN: 10.2 g/dL — AB (ref 12.0–15.0)
MCH: 29 pg (ref 26.0–34.0)
MCHC: 32.5 g/dL (ref 30.0–36.0)
MCV: 89.2 fL (ref 78.0–100.0)
PLATELETS: 198 10*3/uL (ref 150–400)
RBC: 3.52 MIL/uL — AB (ref 3.87–5.11)
RDW: 13.9 % (ref 11.5–15.5)
WBC: 8.8 10*3/uL (ref 4.0–10.5)

## 2016-07-22 MED ORDER — METHOCARBAMOL 500 MG PO TABS: 500.0000 mg | ORAL_TABLET | Freq: Four times a day (QID) | ORAL | 0 refills | Status: AC | PRN

## 2016-07-22 MED ORDER — OXYCODONE HCL 10 MG PO TABS: 10.0000 mg | ORAL_TABLET | ORAL | 0 refills | Status: AC | PRN

## 2016-07-22 NOTE — Progress Notes (Signed)
Orthopedic Tech Progress Note Patient Details:  Sara Underwood [birth year] [id number]  CPM Right Knee CPM Right Knee: On Right Knee Flexion (Degrees): 90 Right Knee Extension (Degrees): 0 Additional Comments: bone foam   Saul FordyceJennifer C Rayvn Rickerson 07/22/2016, 11:47 AM

## 2016-07-22 NOTE — Care Management Note (Signed)
Case Management Note  Patient Details  Name: Sara Underwood MRN: [id number] Date of Birth: 05/16/1941  Subjective/Objective:   76 yr old female s/p right total knee arthroplasty.                 Action/Plan: Patient was [facility] Case manager contacted Marylu [facility]Janet at [facility] [phone] to confirm. CM faxwed all necessary demographics and paper work to her at [phone]. Patient will have family support at discharge.   Expected Discharge Date:  07/22/16               Expected Discharge Plan:  Home w Home Health Services  In-House Referral:     Discharge planning Services  CM Consult  Post Acute Care Choice:  Home Health Choice offered to:  Patient  DME Arranged:   CPM DME Agency:   [employer]  HH Arranged:  RN, PT HH Agency:  [facility] )  Status of Service:  Completed, signed off  If discussed at Long Length of Stay Meetings, dates discussed:    Additional Comments:  Durenda GuthrieBrady, Areliz Rothman Naomi, RN 07/22/2016, 4:01 PM

## 2016-07-22 NOTE — Evaluation (Signed)
Occupational Therapy Evaluation Patient Details Name: Sara Underwood MRN: [id number] DOB: [birth year] Today's Date: 07/22/2016    History of Present Illness 76 y.o. female now s/p Rt TKA. PMH: Lt TKA, stroke, SDH, pacemaker, defibrillator, neuropathy, HTN, incontinence, CHF, Rt THA.   Clinical Impression   Pt admitted as above. Pt plans to d/c home later today w/ PRN family assist. She may benefit from 3:1 in shower as pt feels as though built in shower seat is too low at home. Also discussed possibility to use of LH A/E for LB ADL's but pt was noted to be able to get to her R LE to doff sock from sitting position and anticipates that her spouse will be able to assist with this after d/c. Denies further acute OT needs at this time, will sign off.    Follow Up Recommendations  No OT follow up;Supervision - Intermittent    Equipment Recommendations  Other (comment);3 in 1 bedside commode (Possible LH A/E)    Recommendations for Other Services       Precautions / Restrictions Precautions Precautions: Knee;Fall Restrictions Weight Bearing Restrictions: Yes RLE Weight Bearing: Weight bearing as tolerated      Mobility Bed Mobility Overal bed mobility:  (Pt sitting up in chair upon OT arrival today.)                Transfers Overall transfer level: Needs assistance Equipment used: Rolling walker (2 wheeled) Transfers: Sit to/from UGI CorporationStand;Stand Pivot Transfers Sit to Stand: Supervision Stand pivot transfers: Supervision       General transfer comment: Cues that she does not need to lift RW during functional mobility and transfers. Increased time for tasks, no physical assist     Balance Overall balance assessment: Needs assistance Sitting-balance support: No upper extremity supported;Feet supported Sitting balance-Leahy Scale: Good     Standing balance support: No upper extremity supported;During functional activity Standing balance-Leahy Scale: Fair Standing balance  comment: Standing at sink for grooming                            ADL Overall ADL's : Needs assistance/impaired Eating/Feeding: Independent;Sitting   Grooming: Wash/dry hands;Wash/dry face;Standing;Supervision/safety   Upper Body Bathing: Set up;Sitting   Lower Body Bathing: Min guard;Sit to/from stand   Upper Body Dressing : Set up;Sitting   Lower Body Dressing: Min guard;Sit to/from stand Lower Body Dressing Details (indicate cue type and reason): Pt was supervision for doffing sock from RLE in sitting today. Disucssed possible LH A/E use, pt states that spouse can assist PRN at d/c.     Toileting- Clothing Manipulation and Hygiene: Supervision/safety;Sit to/from stand   Tub/ Shower Transfer: Walk-in shower;Min guard;Ambulation;Shower seat;Grab Designer, jewellerybars;Rolling walker Tub/Shower Transfer Details (indicate cue type and reason): Discussed pt possibly using 3:1 in shower at home as she has built in seat that is low and difficult to get off of per pt report.  Functional mobility during ADLs: Supervision/safety;Rolling walker (VC that she does not have to lift RW when ambulating) General ADL Comments: Pt was assessed followed by education in role of OT, energy conservation. Pt also participated in ADL retraining session to include standing at sink to groom, toileting transfers and simulated shower transfers, recommendation for 3:1 in shower if pt feels as though built in shower seat is too low at home. Also discussed possibility to use of LH A/E for LB ADL's but pt was noted to be able to get to her R LE  to doff sock from sitting position and anticipates that her spouse will be able to assist with this after d/c.     Vision Baseline Vision/History: Wears glasses Wears Glasses: Reading only Patient Visual Report: No change from baseline       Perception     Praxis      Pertinent Vitals/Pain Pain Assessment: 0-10 Pain Score: 3  Pain Location: Rt knee Pain Descriptors /  Indicators: Aching;Sore Pain Intervention(s): Limited activity within patient's tolerance;Monitored during session;Premedicated before session;Repositioned;Ice applied     Hand Dominance Right   Extremity/Trunk Assessment Upper Extremity Assessment Upper Extremity Assessment: Overall WFL for tasks assessed   Lower Extremity Assessment Lower Extremity Assessment: Defer to PT evaluation       Communication Communication Communication: No difficulties   Cognition Arousal/Alertness: Awake/alert Behavior During Therapy: WFL for tasks assessed/performed Overall Cognitive Status: Within Functional Limits for tasks assessed                     General Comments       Exercises       Shoulder Instructions      Home Living Family/patient expects to be discharged to:: Private residence Living Arrangements: Spouse/significant other Available Help at Discharge: Family;Available 24 hours/day Type of Home: House Home Access: Level entry     Home Layout: One level     Bathroom Shower/Tub: Producer, television/film/video: Handicapped height     Home Equipment: Environmental consultant - 2 wheels;Walker - 4 wheels;Shower seat - built in;Grab bars - tub/shower;Hand held shower head;Other (comment) (rails over handicapped height toilet)          Prior Functioning/Environment Level of Independence: Independent with assistive device(s)        Comments: using rollator for ambulation        OT Problem List:        OT Treatment/Interventions:      OT Goals(Current goals can be found in the care plan section) Acute Rehab OT Goals OT Goal Formulation: All assessment and education complete, DC therapy  OT Frequency:     Barriers to D/C:            Co-evaluation              End of Session Equipment Utilized During Treatment: Rolling walker  Activity Tolerance: Patient tolerated treatment well;No increased pain Patient left: in chair;with call bell/phone within  reach                   ADL either performed or assessed with clinical judgement  Time: 0955-1029 OT Time Calculation (min): 34 min Charges:  OT General Charges $OT Visit: 1 Procedure OT Evaluation $OT Eval Low Complexity: 1 Procedure OT Treatments $Self Care/Home Management : 8-22 mins G-Codes: OT G-codes **NOT FOR INPATIENT CLASS** Functional Assessment Tool Used: Clinical judgement;AM-PAC 6 Clicks Daily Activity Functional Assessment Tool Used: Clinical judgement Functional Limitation: Self care Self Care Current Status (Y8657): At least 20 percent but less than 40 percent impaired, limited or restricted Self Care Goal Status (Q4696): At least 1 percent but less than 20 percent impaired, limited or restricted Self Care Discharge Status [phone]): At least 20 percent but less than 40 percent impaired, limited or restricted     Roselie Awkward Dixonm, OTR?L 07/22/2016, 10:45 AM

## 2016-07-22 NOTE — Discharge Summary (Signed)
SPORTS MEDICINE & JOINT REPLACEMENT   Sara Spurling, MD   Laurier Tressia, PA-C [address] [place], [place], [place]  [address]                             [phone]  PATIENT ID: Sara Underwood        MRN:  [id number]          DOB/AGE: 1940/09/01 / 76 y.o.    DISCHARGE SUMMARY  ADMISSION DATE:    07/21/2016 DISCHARGE DATE:   07/22/2016   ADMISSION DIAGNOSIS: primary osteoarthritis right knee     DISCHARGE DIAGNOSIS:  primary osteoarthritis right knee     ADDITIONAL DIAGNOSIS: Active Problems:   S/P total knee replacement  Past Medical History:  Diagnosis Date  . ASD (atrial septal defect)    small ASD in the interatrial septum with left to right flow by Doppler 08/20/14 [facility])  . Automatic implantable cardioverter-defibrillator in situ    St Jude CRT-D 02/15/08 (reported generator change 05/2014), managed by Dr. Gary Fleet at [facility]  . CHF (congestive heart failure) (HCC)   . Constipation   . Dysrhythmia    PAF  . Exertional shortness of breath   . History of bladder infections ~ 01/2013  . History of blood transfusion    "when I had ovary removed" (04/11/2013)  . History of colon polyps   . Hypertension    takes Diltiazem daily and Lisinopril bid  . Joint pain   . Joint swelling   . Low iron   . Neuropathy (HCC)    left  . Osteoarthritis    "some in my back; in my left knee" (04/11/2013)  . Pacemaker   . Peripheral edema    left leg  . SDH (subdural hematoma) (HCC)    09/2013 (while on warfarin)  . Stroke [facility])    sometime between 2014-2016  . Urinary incontinence     PROCEDURE: Procedure(s): RIGHT TOTAL KNEE ARTHROPLASTY on 07/21/2016  CONSULTS:    HISTORY:  See H&P in chart  HOSPITAL COURSE:  Sara Underwood is a 76 y.o. admitted on 07/21/2016 and found to have a diagnosis of primary osteoarthritis right knee .  After appropriate laboratory studies were obtained  they were taken to the operating room on 07/21/2016 and underwent Procedure(s): RIGHT TOTAL  KNEE ARTHROPLASTY.   They were given perioperative antibiotics:  Anti-infectives    Start     Dose/Rate Route Frequency Ordered Stop   07/21/16 1600  ceFAZolin (ANCEF) IVPB 1 g/50 mL premix     1 g 100 mL/hr over 30 Minutes Intravenous Every 6 hours 07/21/16 1455 07/21/16 2252   07/21/16 0722  ceFAZolin (ANCEF) 2-4 GM/100ML-% IVPB    Comments:  Yetta Barre, Tomika   : cabinet override      07/21/16 0722 07/21/16 0958   07/21/16 0719  ceFAZolin (ANCEF) IVPB 2g/100 mL premix     2 g 200 mL/hr over 30 Minutes Intravenous On call to O.R. 07/21/16 2952 07/21/16 8413    .  Patient given tranexamic acid IV or topical and exparel intra-operatively.  Tolerated the procedure well.    POD# 1: Vital signs were stable.  Patient denied Chest pain, shortness of breath, or calf pain.  Patient was started on Lovenox 30 mg subcutaneously twice daily at 8am.  Consults to PT, OT, and care management were made.  The patient was weight bearing as tolerated.  CPM was placed on  the operative leg 0-90 degrees for 6-8 hours a day. When out of the CPM, patient was placed in the foam block to achieve full extension. Incentive spirometry was taught.  Dressing was changed.       POD #2, Continued  PT for ambulation and exercise program.  IV saline locked.  O2 discontinued.    The remainder of the hospital course was dedicated to ambulation and strengthening.   The patient was discharged on 1 Day Post-Op in  Good condition.  Blood products given:none  DIAGNOSTIC STUDIES: Recent vital signs: Patient Vitals for the past 24 hrs:  BP Temp Temp src Pulse Resp SpO2  07/22/16 0917 130/63 - - - - -  07/22/16 0546 (!) 142/63 97.5 F (36.4 C) Oral 89 20 96 %  07/22/16 0029 (!) 147/70 97.7 F (36.5 C) Oral 82 20 96 %  07/21/16 2059 (!) 141/68 97.7 F (36.5 C) Oral 86 20 96 %  07/21/16 1500 (!) 155/93 97.7 F (36.5 C) Oral 68 20 94 %  07/21/16 1445 - 98.1 F (36.7 C) - 83 20 98 %  07/21/16 1435 (!) 160/96 - - 85 20 96 %   07/21/16 1430 - - - 84 (!) 27 96 %  07/21/16 1420 (!) 143/97 - - 83 (!) 23 96 %  07/21/16 1415 - - - 89 20 96 %  07/21/16 1405 (!) 151/96 - - 86 (!) 21 96 %  07/21/16 1400 - - - 84 13 97 %       Recent laboratory studies:  Recent Labs  07/22/16 0529  WBC 8.8  HGB 10.2*  HCT 31.4*  PLT 198    Recent Labs  07/22/16 0529  NA 139  K 3.4*  CL 106  CO2 24  BUN 12  CREATININE 0.54  GLUCOSE 105*  CALCIUM 8.2*   Lab Results  Component Value Date   INR 1.03 07/21/2016   INR 1.64 (H) 04/13/2013   INR 1.16 04/12/2013     Recent Radiographic Studies :  No results found.  DISCHARGE INSTRUCTIONS: Discharge Instructions    CPM    Complete by:  As directed    Continuous passive motion machine (CPM):      Use the CPM from 0 to 90 for 4-6 hours per day.      You may increase by 10 per day.  You may break it up into 2 or 3 sessions per day.      Use CPM for 2 weeks or until you are told to stop.   Call MD / Call 911    Complete by:  As directed    If you experience chest pain or shortness of breath, CALL 911 and be transported to the hospital emergency room.  If you develope a fever above 101 F, pus (white drainage) or increased drainage or redness at the wound, or calf pain, call your surgeon's office.   Constipation Prevention    Complete by:  As directed    Drink plenty of fluids.  Prune juice may be helpful.  You may use a stool softener, such as Colace (over the counter) 100 mg twice a day.  Use MiraLax (over the counter) for constipation as needed.   Diet - low sodium heart healthy    Complete by:  As directed    Discharge instructions    Complete by:  As directed    INSTRUCTIONS AFTER JOINT REPLACEMENT   Remove items at home which could result in a fall.  This includes throw rugs or furniture in walking pathways ICE to the affected joint every three hours while awake for 30 minutes at a time, for at least the first 3-5 days, and then as needed for pain and swelling.   Continue to use ice for pain and swelling. You may notice swelling that will progress down to the foot and ankle.  This is normal after surgery.  Elevate your leg when you are not up walking on it.   Continue to use the breathing machine you got in the hospital (incentive spirometer) which will help keep your temperature down.  It is common for your temperature to cycle up and down following surgery, especially at night when you are not up moving around and exerting yourself.  The breathing machine keeps your lungs expanded and your temperature down.   DIET:  As you were doing prior to hospitalization, we recommend a well-balanced diet.  DRESSING / WOUND CARE / SHOWERING  Keep the surgical dressing until follow up.  The dressing is water proof, so you can shower without any extra covering.  IF THE DRESSING FALLS OFF or the wound gets wet inside, change the dressing with sterile gauze.  Please use good hand washing techniques before changing the dressing.  Do not use any lotions or creams on the incision until instructed by your surgeon.    ACTIVITY  Increase activity slowly as tolerated, but follow the weight bearing instructions below.   No driving for 6 weeks or until further direction given by your physician.  You cannot drive while taking narcotics.  No lifting or carrying greater than 10 lbs. until further directed by your surgeon. Avoid periods of inactivity such as sitting longer than an hour when not asleep. This helps prevent blood clots.  You may return to work once you are authorized by your doctor.     WEIGHT BEARING   Weight bearing as tolerated with assist device (walker, cane, etc) as directed, use it as long as suggested by your surgeon or therapist, typically at least 4-6 weeks.   EXERCISES  Results after joint replacement surgery are often greatly improved when you follow the exercise, range of motion and muscle strengthening exercises prescribed by your doctor. Safety  measures are also important to protect the joint from further injury. Any time any of these exercises cause you to have increased pain or swelling, decrease what you are doing until you are comfortable again and then slowly increase them. If you have problems or questions, call your caregiver or physical therapist for advice.   Rehabilitation is important following a joint replacement. After just a few days of immobilization, the muscles of the leg can become weakened and shrink (atrophy).  These exercises are designed to build up the tone and strength of the thigh and leg muscles and to improve motion. Often times heat used for twenty to thirty minutes before working out will loosen up your tissues and help with improving the range of motion but do not use heat for the first two weeks following surgery (sometimes heat can increase post-operative swelling).   These exercises can be done on a training (exercise) mat, on the floor, on a table or on a bed. Use whatever works the best and is most comfortable for you.    Use music or television while you are exercising so that the exercises are a pleasant break in your day. This will make your life better with the exercises acting as a break in  your routine that you can look forward to.   Perform all exercises about fifteen times, three times per day or as directed.  You should exercise both the operative leg and the other leg as well.   Exercises include:   Quad Sets - Tighten up the muscle on the front of the thigh (Quad) and hold for 5-10 seconds.   Straight Leg Raises - With your knee straight (if you were given a brace, keep it on), lift the leg to 60 degrees, hold for 3 seconds, and slowly lower the leg.  Perform this exercise against resistance later as your leg gets stronger.  Leg Slides: Lying on your back, slowly slide your foot toward your buttocks, bending your knee up off the floor (only go as far as is comfortable). Then slowly slide your foot  back down until your leg is flat on the floor again.  Angel Wings: Lying on your back spread your legs to the side as far apart as you can without causing discomfort.  Hamstring Strength:  Lying on your back, push your heel against the floor with your leg straight by tightening up the muscles of your buttocks.  Repeat, but this time bend your knee to a comfortable angle, and push your heel against the floor.  You may put a pillow under the heel to make it more comfortable if necessary.   A rehabilitation program following joint replacement surgery can speed recovery and prevent re-injury in the future due to weakened muscles. Contact your doctor or a physical therapist for more information on knee rehabilitation.    CONSTIPATION  Constipation is defined medically as fewer than three stools per week and severe constipation as less than one stool per week.  Even if you have a regular bowel pattern at home, your normal regimen is likely to be disrupted due to multiple reasons following surgery.  Combination of anesthesia, postoperative narcotics, change in appetite and fluid intake all can affect your bowels.   YOU MUST use at least one of the following options; they are listed in order of increasing strength to get the job done.  They are all available over the counter, and you may need to use some, POSSIBLY even all of these options:    Drink plenty of fluids (prune juice may be helpful) and high fiber foods Colace 100 mg by mouth twice a day  Senokot for constipation as directed and as needed Dulcolax (bisacodyl), take with full glass of water  Miralax (polyethylene glycol) once or twice a day as needed.  If you have tried all these things and are unable to have a bowel movement in the first 3-4 days after surgery call either your surgeon or your primary doctor.    If you experience loose stools or diarrhea, hold the medications until you stool forms back up.  If your symptoms do not get better  within 1 week or if they get worse, check with your doctor.  If you experience "the worst abdominal pain ever" or develop nausea or vomiting, please contact the office immediately for further recommendations for treatment.   ITCHING:  If you experience itching with your medications, try taking only a single pain pill, or even half a pain pill at a time.  You can also use Benadryl over the counter for itching or also to help with sleep.   TED HOSE STOCKINGS:  Use stockings on both legs until for at least 2 weeks or as directed by physician office. They  may be removed at night for sleeping.  MEDICATIONS:  See your medication summary on the "After Visit Summary" that nursing will review with you.  You may have some home medications which will be placed on hold until you complete the course of blood thinner medication.  It is important for you to complete the blood thinner medication as prescribed.  PRECAUTIONS:  If you experience chest pain or shortness of breath - call 911 immediately for transfer to the hospital emergency department.   If you develop a fever greater that 101 F, purulent drainage from wound, increased redness or drainage from wound, foul odor from the wound/dressing, or calf pain - CONTACT YOUR SURGEON.                                                   FOLLOW-UP APPOINTMENTS:  If you do not already have a post-op appointment, please call the office for an appointment to be seen by your surgeon.  Guidelines for how soon to be seen are listed in your "After Visit Summary", but are typically between 1-4 weeks after surgery.  OTHER INSTRUCTIONS:   Knee Replacement:  Do not place pillow under knee, focus on keeping the knee straight while resting. CPM instructions: 0-90 degrees, 2 hours in the morning, 2 hours in the afternoon, and 2 hours in the evening. Place foam block, curve side up under heel at all times except when in CPM or when walking.  DO NOT modify, tear, cut, or change the  foam block in any way.  MAKE SURE YOU:  Understand these instructions.  Get help right away if you are not doing well or get worse.    Thank you for letting us be a part of your medical care team.  It is a privilege we respect greatly.  We hope these instructions will help you stay on track for a fast and full recovery!   Increase activity slowly as tolerated    Complete by:  As directed       DISCHARGE MEDICATIONS:   Allergies as of 07/22/2016      Reactions   Iodine Hives, Swelling   Povidone Iodine Hives   Shellfish-derived Products Itching   Can eat shellfish, but cannot touch raw shrimp   Tape Hives      Medication List    STOP taking these medications   enoxaparin 40 MG/0.4ML injection Commonly known as:  LOVENOX     TAKE these medications   acetaminophen 500 MG tablet Commonly known as:  TYLENOL Take 1,000 mg by mouth every 6 (six) hours as needed for mild pain.   CALCIUM-MAG-VIT C-VIT D PO Take 2 tablets by mouth 2 (two) times daily.   celecoxib 200 MG capsule Commonly known as:  CELEBREX Take 1 capsule (200 mg total) by mouth every 12 (twelve) hours.   cephALEXin 500 MG capsule Commonly known as:  KEFLEX TAKE 1 CAPSULE BY MOUTH TWICE DAILY FOR 5 DAYS   denosumab 60 MG/ML Soln injection Commonly known as:  PROLIA Inject 60 mg into the skin every 6 (six) months. Administer in upper arm, thigh, or abdomen NEXT INJECTION IS DUE IN MAY   ELIQUIS 5 MG Tabs tablet Generic drug:  apixaban Take 5 mg by mouth 2 (two) times daily.   furosemide 20 MG tablet Commonly known as:  LASIX Take  20 mg by mouth daily.   lisinopril 10 MG tablet Commonly known as:  PRINIVIL,ZESTRIL Take 30 mg by mouth daily.   methocarbamol 500 MG tablet Commonly known as:  ROBAXIN Take 1-2 tablets (500-1,000 mg total) by mouth every 6 (six) hours as needed for muscle spasms.   metoprolol succinate 100 MG 24 hr tablet Commonly known as:  TOPROL-XL Take 100 mg by mouth every  evening.   Oxycodone HCl 10 MG Tabs Take 1 tablet (10 mg total) by mouth every 3 (three) hours as needed for breakthrough pain. What changed:  medication strength  how much to take   sotalol 80 MG tablet Commonly known as:  BETAPACE Take 80 mg by mouth every 12 (twelve) hours.   tetrahydrozoline 0.05 % ophthalmic solution Place 2 drops into both eyes daily as needed (DRY EYES).   Vitamin D (Ergocalciferol) 50000 units Caps capsule Commonly known as:  DRISDOL Take 50,000 Units by mouth every 7 (seven) days. ON MONDAYS            Durable Medical Equipment        Start     Ordered   07/21/16 1456  DME Walker rolling  Once    Question:  Patient needs a walker to treat with the following condition  Answer:  S/P total knee replacement   07/21/16 1455   07/21/16 1456  DME 3 n 1  Once     07/21/16 1455   07/21/16 1456  DME Bedside commode  Once    Question:  Patient needs a bedside commode to treat with the following condition  Answer:  S/P total knee replacement   07/21/16 1455      FOLLOW UP VISIT:    DISPOSITION: HOME VS. SNF  CONDITION:  Good   Guy Sandifer 07/22/2016, 1:30 PM

## 2016-07-22 NOTE — Progress Notes (Signed)
Sara LamasNancy T Sobotka to be D/C'd Home per MD order.  Discussed with the patient and all questions fully answered. Per patient's RN Nicole CellaDorothy it is okay for patient to discharge.   An After Visit Summary was printed and given to the patient. Patient received prescription.  D/c education completed with patient/family including follow up instructions, medication list, d/c activities limitations if indicated, with other d/c instructions as indicated by MD - patient able to verbalize understanding, all questions fully answered.   Patient instructed to return to ED, call 911, or call MD for any changes in condition.   Patient to be escorted via WC, and D/C home via private auto.  [name] C 07/22/2016 2:08 PM

## 2016-07-22 NOTE — Progress Notes (Signed)
Physical Therapy Treatment Patient Details Name: Sara Underwood MRN: [id number] DOB: 1941-03-23 Today's Date: 07/22/2016    History of Present Illness 76 y.o. female now s/p Rt TKA. PMH: Lt TKA, stroke, SDH, pacemaker, defibrillator, neuropathy, HTN, incontinence, CHF, Rt THA.    PT Comments    Patient is making good progress with PT.  From a mobility standpoint anticipate patient will be ready for DC home when medically ready.      Follow Up Recommendations  Home health PT;Supervision for mobility/OOB     Equipment Recommendations  None recommended by PT    Recommendations for Other Services       Precautions / Restrictions Precautions Precautions: Knee;Fall Precaution Booklet Issued: Yes (comment) Precaution Comments: HEP, reviewed knee extension precautions Restrictions Weight Bearing Restrictions: Yes RLE Weight Bearing: Weight bearing as tolerated    Mobility  Bed Mobility Overal bed mobility:  (Pt sitting up in chair upon OT arrival today.)             General bed mobility comments: pt OOB in chair upon arrival  Transfers Overall transfer level: Needs assistance Equipment used: Rolling walker (2 wheeled) Transfers: Sit to/from Stand Sit to Stand: Min guard Stand pivot transfers: Supervision       General transfer comment: cues for hand placement  Ambulation/Gait Ambulation/Gait assistance: Supervision Ambulation Distance (Feet): 125 Feet Assistive device: Rolling walker (2 wheeled) Gait Pattern/deviations: Decreased weight shift to right;Step-through pattern;Decreased stride length;Decreased dorsiflexion - right;Decreased dorsiflexion - left Gait velocity: decreased   General Gait Details: cues for sequencing and posture; pt with improved WB on R LE and step through pattern   Stairs            Wheelchair Mobility    Modified Rankin (Stroke Patients Only)       Balance Overall balance assessment: Needs assistance Sitting-balance  support: No upper extremity supported Sitting balance-Leahy Scale: Good     Standing balance support: Bilateral upper extremity supported Standing balance-Leahy Scale: Poor Standing balance comment: using rw                    Cognition Arousal/Alertness: Awake/alert Behavior During Therapy: WFL for tasks assessed/performed Overall Cognitive Status: Within Functional Limits for tasks assessed                      Exercises Total Joint Exercises Quad Sets: AROM;Both;10 reps Short Arc Quad: AROM;Right;10 reps Heel Slides: AROM;Right;10 reps Hip ABduction/ADduction: AROM;Right;10 reps Straight Leg Raises: AROM;Right;10 reps Long Arc Quad: AROM;Right;10 reps Knee Flexion: AROM;Right;5 reps;Seated;Other (comment) (10 sec holds) Goniometric ROM: 0-92    General Comments        Pertinent Vitals/Pain Pain Assessment: Faces Pain Score: 3  Faces Pain Scale: Hurts little more Pain Location: Rt knee Pain Descriptors / Indicators: Sore Pain Intervention(s): Limited activity within patient's tolerance;Monitored during session;Premedicated before session;Repositioned    Home Living Family/patient expects to be discharged to:: Private residence Living Arrangements: Spouse/significant other Available Help at Discharge: Family;Available 24 hours/day Type of Home: House Home Access: Level entry   Home Layout: One level Home Equipment: Walker - 2 wheels;Walker - 4 wheels;Shower seat - built in;Grab bars - tub/shower;Hand held shower head;Other (comment) (rails over handicapped height toilet)      Prior Function Level of Independence: Independent with assistive device(s)      Comments: using rollator for ambulation   PT Goals (current goals can now be found in the care plan section) Acute Rehab PT Goals  Patient Stated Goal: no pain with activity PT Goal Formulation: With patient Time For Goal Achievement: 08/04/16 Potential to Achieve Goals: Good Progress towards  PT goals: Progressing toward goals    Frequency    7X/week      PT Plan Current plan remains appropriate    Co-evaluation             End of Session Equipment Utilized During Treatment: Gait belt Activity Tolerance: Patient tolerated treatment well Patient left: in chair;with call bell/phone within reach;with family/visitor present (in bone foam) Nurse Communication: Mobility status       Time: [id number] PT Time Calculation (min) (ACUTE ONLY): 41 min  Charges:  $Gait Training: 8-22 mins $Therapeutic Exercise: 23-37 mins                    G Codes:       Derek MoundKellyn R Saagar Tortorella Zuma Hust, PTA Pager: [phone]   07/22/2016, 1:39 PM

## 2016-07-22 NOTE — Care Management Obs Status (Signed)
MEDICARE OBSERVATION STATUS NOTIFICATION   Patient Details  Name: Sara Underwood MRN: [id number] Date of Birth: 01/20/1941   Medicare Observation Status Notification Given:  Yes    Lawerance Sabalebbie Evelio Rueda, RN 07/22/2016, 12:55 PM

## 2016-07-22 NOTE — Progress Notes (Signed)
SPORTS MEDICINE AND JOINT REPLACEMENT  Sara SpurlingStephen Lucey, MD    Laurier Nancyolby Robbins, PA-C [address], [place], [place]  [id number]                             [phone]   PROGRESS NOTE  Subjective:  negative for Chest Pain  negative for Shortness of Breath  negative for Nausea/Vomiting   negative for Calf Pain  negative for Bowel Movement   Tolerating Diet: yes         Patient reports pain as 3 on 0-10 scale.    Objective: Vital signs in last 24 hours:   Patient Vitals for the past 24 hrs:  BP Temp Temp src Pulse Resp SpO2 Height  07/22/16 0546 (!) 142/63 97.5 F (36.4 C) Oral 89 20 96 % -  07/22/16 0029 (!) 147/70 97.7 F (36.5 C) Oral 82 20 96 % -  07/21/16 2059 (!) 141/68 97.7 F (36.5 C) Oral 86 20 96 % -  07/21/16 1500 (!) 155/93 97.7 F (36.5 C) Oral 68 20 94 % -  07/21/16 1445 - 98.1 F (36.7 C) - 83 20 98 % -  07/21/16 1435 (!) 160/96 - - 85 20 96 % -  07/21/16 1430 - - - 84 (!) 27 96 % -  07/21/16 1420 (!) 143/97 - - 83 (!) 23 96 % -  07/21/16 1415 - - - 89 20 96 % -  07/21/16 1405 (!) 151/96 - - 86 (!) 21 96 % -  07/21/16 1400 - - - 84 13 97 % -  07/21/16 1330 - - - 83 19 96 % -  07/21/16 1322 (!) 147/94 - - 84 (!) 25 96 % -  07/21/16 1315 - - - 83 (!) 21 94 % -  07/21/16 1305 (!) 162/90 - - 80 20 93 % -  07/21/16 1300 - - - 84 (!) 21 93 % -  07/21/16 1250 (!) 158/93 - - 87 (!) 21 93 % -  07/21/16 1245 - - - 82 17 93 % -  07/21/16 1235 (!) 156/90 - - 80 18 93 % -  07/21/16 1230 - - - 80 19 93 % -  07/21/16 1220 (!) 158/88 - - 80 17 97 % -  07/21/16 1215 - - - 80 15 94 % -  07/21/16 1205 (!) 151/91 - - 81 15 96 % -  07/21/16 1200 - - - 80 15 92 % -  07/21/16 1150 (!) 154/91 97.4 F (36.3 C) - 80 11 94 % -  07/21/16 0920 (!) 156/97 - - 80 11 98 % -  07/21/16 0910 - - - 98 16 98 % -  07/21/16 0805 - - - - - - 5\' 8"  (1.727 m)    @flow {1959:LAST@   Intake/Output from previous day:   02/19 0701 - 02/20 0700 In: 1290 [P.O.:240; I.V.:1000] Out: 56  [Urine:1]   Intake/Output this shift:   No intake/output data recorded.   Intake/Output      02/19 0701 - 02/20 0700 02/20 0701 - 02/21 0700   P.O. 240    I.V. (mL/kg) 1000 (14.1)    IV Piggyback 50    Total Intake(mL/kg) 1290 (18.2)    Urine (mL/kg/hr) 1 (0)    Blood 55 (0)    Total Output 56     Net +1234             LABORATORY  DATA:  Recent Labs  07/22/16 0529  WBC 8.8  HGB 10.2*  HCT 31.4*  PLT 198    Recent Labs  07/22/16 0529  NA 139  K 3.4*  CL 106  CO2 24  BUN 12  CREATININE 0.54  GLUCOSE 105*  CALCIUM 8.2*   Lab Results  Component Value Date   INR 1.03 07/21/2016   INR 1.64 (H) 04/13/2013   INR 1.16 04/12/2013    Examination:  General appearance: alert, cooperative and no distress Extremities: extremities normal, atraumatic, no cyanosis or edema  Wound Exam: clean, dry, intact   Drainage:  None: wound tissue dry  Motor Exam: Quadriceps and Hamstrings Intact  Sensory Exam: Superficial Peroneal, Deep Peroneal and Tibial normal   Assessment:    1 Day Post-Op  Procedure(s) (LRB): RIGHT TOTAL KNEE ARTHROPLASTY (Right)  ADDITIONAL DIAGNOSIS:  Active Problems:   S/P total knee replacement    Plan: Physical Therapy as ordered Weight Bearing as Tolerated (WBAT)  DVT Prophylaxis:  eliquis  DISCHARGE PLAN: Home  DISCHARGE NEEDS: HHPT   Patient doing great, will D/C home today         Guy Sandifer 07/22/2016, 7:14 AM

## 2016-07-23 ENCOUNTER — Encounter (HOSPITAL_COMMUNITY): Payer: Self-pay | Admitting: Orthopedic Surgery
